# Patient Record
Sex: Female | Born: 1950 | Race: White | Hispanic: No | Marital: Married | Smoking: Never smoker
Health system: Southern US, Community
[De-identification: ages and names within clinical notes are randomized; demographics above are authoritative.]

## PROBLEM LIST (undated history)

## (undated) DIAGNOSIS — I10 Essential (primary) hypertension: Secondary | ICD-10-CM

## (undated) DIAGNOSIS — K219 Gastro-esophageal reflux disease without esophagitis: Secondary | ICD-10-CM

## (undated) DIAGNOSIS — E039 Hypothyroidism, unspecified: Secondary | ICD-10-CM

## (undated) DIAGNOSIS — E119 Type 2 diabetes mellitus without complications: Secondary | ICD-10-CM

## (undated) DIAGNOSIS — F32A Depression, unspecified: Secondary | ICD-10-CM

## (undated) DIAGNOSIS — F329 Major depressive disorder, single episode, unspecified: Secondary | ICD-10-CM

## (undated) DIAGNOSIS — E049 Nontoxic goiter, unspecified: Secondary | ICD-10-CM

## (undated) HISTORY — PX: OTHER SURGICAL HISTORY: SHX169

## (undated) HISTORY — DX: Depression, unspecified: F32.A

## (undated) HISTORY — PX: TUBAL LIGATION: SHX77

## (undated) HISTORY — PX: APPENDECTOMY: SHX54

## (undated) HISTORY — DX: Major depressive disorder, single episode, unspecified: F32.9

## (undated) HISTORY — DX: Type 2 diabetes mellitus without complications: E11.9

## (undated) HISTORY — DX: Nontoxic goiter, unspecified: E04.9

## (undated) HISTORY — PX: ABDOMINAL HYSTERECTOMY: SHX81

## (undated) HISTORY — PX: BACK SURGERY: SHX140

## (undated) HISTORY — DX: Gastro-esophageal reflux disease without esophagitis: K21.9

## (undated) HISTORY — DX: Essential (primary) hypertension: I10

---

## 2002-01-23 ENCOUNTER — Emergency Department (HOSPITAL_COMMUNITY): Admission: EM | Admit: 2002-01-23 | Discharge: 2002-01-23 | Payer: Self-pay | Admitting: Internal Medicine

## 2003-08-22 ENCOUNTER — Ambulatory Visit (HOSPITAL_COMMUNITY): Admission: RE | Admit: 2003-08-22 | Discharge: 2003-08-22 | Payer: Self-pay | Admitting: Ophthalmology

## 2003-11-07 ENCOUNTER — Ambulatory Visit (HOSPITAL_COMMUNITY): Admission: RE | Admit: 2003-11-07 | Discharge: 2003-11-07 | Payer: Self-pay | Admitting: Ophthalmology

## 2005-04-25 ENCOUNTER — Encounter: Admission: RE | Admit: 2005-04-25 | Discharge: 2005-04-25 | Payer: Self-pay | Admitting: General Surgery

## 2005-04-25 ENCOUNTER — Encounter (INDEPENDENT_AMBULATORY_CARE_PROVIDER_SITE_OTHER): Payer: Self-pay | Admitting: Specialist

## 2005-10-10 ENCOUNTER — Encounter: Admission: RE | Admit: 2005-10-10 | Discharge: 2005-10-10 | Payer: Self-pay | Admitting: General Surgery

## 2007-01-05 ENCOUNTER — Emergency Department (HOSPITAL_COMMUNITY): Admission: EM | Admit: 2007-01-05 | Discharge: 2007-01-05 | Payer: Self-pay | Admitting: Emergency Medicine

## 2010-10-12 LAB — DIFFERENTIAL
Basophils Absolute: 0.1
Basophils Relative: 1
Eosinophils Absolute: 0.7
Eosinophils Relative: 9 — ABNORMAL HIGH
Lymphocytes Relative: 36
Lymphs Abs: 2.7
Monocytes Absolute: 0.4
Monocytes Relative: 5
Neutro Abs: 3.7
Neutrophils Relative %: 49

## 2010-10-12 LAB — POCT CARDIAC MARKERS
CKMB, poc: 1.8
CKMB, poc: 2
Myoglobin, poc: 31.7
Myoglobin, poc: 62.1
Operator id: 263501
Operator id: 286941
Troponin i, poc: <0.05
Troponin i, poc: <0.05

## 2010-10-12 LAB — BASIC METABOLIC PANEL
BUN: 11
CO2: 24
Calcium: 9.3
Chloride: 103
Creatinine, Ser: 0.71
GFR calc Af Amer: >60
GFR calc non Af Amer: >60
Glucose, Bld: 87
Potassium: 3.3 — ABNORMAL LOW
Sodium: 139

## 2010-10-12 LAB — CBC
HCT: 39.6
Hemoglobin: 13.6
MCHC: 34.2
MCV: 85.3
Platelets: 212
RBC: 4.65
RDW: 13.4
WBC: 7.5

## 2014-07-18 ENCOUNTER — Other Ambulatory Visit (HOSPITAL_COMMUNITY): Payer: Self-pay | Admitting: Physician Assistant

## 2014-07-18 DIAGNOSIS — E049 Nontoxic goiter, unspecified: Secondary | ICD-10-CM

## 2014-07-22 ENCOUNTER — Ambulatory Visit (HOSPITAL_COMMUNITY)
Admission: RE | Admit: 2014-07-22 | Discharge: 2014-07-22 | Disposition: A | Discharge status: Home / Self Care | Payer: Medicare HMO | Source: Ambulatory Visit | Attending: Physician Assistant | Admitting: Physician Assistant

## 2014-07-22 DIAGNOSIS — R1310 Dysphagia, unspecified: Secondary | ICD-10-CM | POA: Diagnosis not present

## 2014-07-22 DIAGNOSIS — E010 Iodine-deficiency related diffuse (endemic) goiter: Secondary | ICD-10-CM | POA: Insufficient documentation

## 2014-07-22 DIAGNOSIS — E049 Nontoxic goiter, unspecified: Secondary | ICD-10-CM

## 2014-09-21 ENCOUNTER — Other Ambulatory Visit (HOSPITAL_COMMUNITY): Payer: Self-pay | Admitting: Respiratory Therapy

## 2014-09-21 DIAGNOSIS — R5383 Other fatigue: Secondary | ICD-10-CM

## 2014-09-21 DIAGNOSIS — R062 Wheezing: Secondary | ICD-10-CM

## 2014-10-27 ENCOUNTER — Other Ambulatory Visit: Payer: Self-pay | Admitting: "Endocrinology

## 2014-11-29 ENCOUNTER — Ambulatory Visit: Payer: Medicare HMO | Attending: Physician Assistant | Admitting: Sleep Medicine

## 2014-11-29 DIAGNOSIS — R5383 Other fatigue: Secondary | ICD-10-CM

## 2014-11-29 DIAGNOSIS — R062 Wheezing: Secondary | ICD-10-CM

## 2014-11-29 DIAGNOSIS — G479 Sleep disorder, unspecified: Secondary | ICD-10-CM | POA: Insufficient documentation

## 2014-12-03 NOTE — Sleep Study (Addendum)
  Hamlet A. Merlene Laughter, MD     [email or web address]             NOCTURNAL POLYSOMNOGRAPHY   LOCATION: [facility]    Patient Name: Bethany Myers, Bethany Myers Date: 11/29/2014 Gender: Female D.O.B: 07-31-1950 Age (years): 39 Referring Provider: Antionette Fairy Height (inches): 63 Interpreting Physician: Phillips Odor MD, ABSM Weight (lbs): 245 RPSGT: Rosebud Poles BMI: 43 MRN: [id number] Neck Size: 16.50 CLINICAL INFORMATION Sleep Study Type: NPSG Indication for sleep study: Fatigue Epworth Sleepiness Score: NA SLEEP STUDY TECHNIQUE As per the AASM Manual for the Scoring of Sleep and Associated Events v2.3 (April 2016) with a hypopnea requiring 4% desaturations. The channels recorded and monitored were frontal, central and occipital EEG, electrooculogram (EOG), submentalis EMG (chin), nasal and oral airflow, thoracic and abdominal wall motion, anterior tibialis EMG, snore microphone, electrocardiogram, and pulse oximetry. MEDICATIONS Prior to Admission medications   Medication Sig Start Date End Date Taking? Authorizing Provider  levothyroxine (SYNTHROID, LEVOTHROID) 50 MCG tablet TAKE 1 TABLET BY MOUTH IN THE MORNING. 10/27/14   Cassandria Anger, MD      SLEEP ARCHITECTURE The study was initiated at 10:50:43 PM and ended at 5:13:27 AM. Sleep onset time was 147.8 minutes and the sleep efficiency was 51.6%. The total sleep time was 197.5 minutes. Stage REM latency was N/A minutes. The patient spent 2.78% of the night in stage N1 sleep, 82.78% in stage N2 sleep, 14.43% in stage N3 and 0.00% in REM. Alpha intrusion was absent. Supine sleep was 0.00%. RESPIRATORY PARAMETERS The overall apnea/hypopnea index (AHI) was 3.3 per hour. There were 0 total apneas, including 0 obstructive, 0 central and 0 mixed apneas. There were 11 hypopneas and 0 RERAs. The AHI during Stage REM sleep was N/A per hour. AHI while supine was N/A per hour. The mean oxygen saturation  was 91.02%. The minimum SpO2 during sleep was 88.00%. Moderate snoring was noted during this study. CARDIAC DATA The 2 lead EKG demonstrated sinus rhythm. The mean heart rate was 67.49 beats per minute. Other EKG findings include: None. LEG MOVEMENT DATA The total PLMS were 0 with a resulting PLMS index of 0.00. Associated arousal with leg movement index was 0.0   IMPRESSIONS - Abnormal sleep architecture with markedly prolonged sleep onset and reduced sleep efficiency.  - No significant obstructive sleep apnea occurred during this study.   Delano Metz, MD Diplomate, American Board of Sleep Medicine.

## 2014-12-19 ENCOUNTER — Ambulatory Visit: Payer: Self-pay | Admitting: "Endocrinology

## 2017-03-10 ENCOUNTER — Encounter: Payer: Self-pay | Admitting: Internal Medicine

## 2017-03-12 ENCOUNTER — Telehealth: Payer: Self-pay

## 2017-03-12 NOTE — Telephone Encounter (Signed)
Please accept this pt as a new pt per Dr.Scott.Husband is Cesilia Shinn

## 2017-05-07 ENCOUNTER — Encounter: Payer: Self-pay | Admitting: Gastroenterology

## 2017-05-07 ENCOUNTER — Other Ambulatory Visit: Payer: Self-pay

## 2017-05-07 ENCOUNTER — Ambulatory Visit: Payer: Medicare HMO | Admitting: Gastroenterology

## 2017-05-07 DIAGNOSIS — R195 Other fecal abnormalities: Secondary | ICD-10-CM

## 2017-05-07 MED ORDER — PEG 3350-KCL-NA BICARB-NACL 420 G PO SOLR: 4000.0000 mL | ORAL | 0 refills | Status: DC

## 2017-05-07 NOTE — Progress Notes (Signed)
Primary Care Physician:  Zhou-Talbert, Elwyn Lade, MD  Primary Gastroenterologist:  Garfield Cornea, MD   Chief Complaint  Patient presents with  . positive FIT test    HPI:  Bethany Myers is a 67 y.o. female here at the request of Dr. Angelia Mould for further evaluation of Hemoccult positive stool.  Patient states she is never had a colonoscopy.  She does her yearly Hemoccults, typically they have been negative.  This past 1 was positive however. No overt GI bleeding. No brbpr, melena.  Rarely has constipation.  She had back surgery last year and developed loss sensation of the perineum, just recently starting to get the sensation back.  Made bowel movements a little bit more difficult but this is improved.  Denies upper GI symptoms, dysphagia.  Current Outpatient Medications  Medication Sig Dispense Refill  . amLODipine (NORVASC) 5 MG tablet Take 5 mg by mouth daily.     Marland Kitchen FLUoxetine (PROZAC) 40 MG capsule Take 40 mg by mouth daily.     . hydrochlorothiazide (HYDRODIURIL) 25 MG tablet Take 25 mg by mouth daily.     Marland Kitchen levothyroxine (SYNTHROID, LEVOTHROID) 50 MCG tablet TAKE 1 TABLET BY MOUTH IN THE MORNING. 30 tablet 2   No current facility-administered medications for this visit.     Allergies as of 05/07/2017 - Review Complete 05/07/2017  Allergen Reaction Noted  . Trimethoprim  05/07/2017    Past Medical History:  Diagnosis Date  . Depression   . Goiter   . Hypertension     Past Surgical History:  Procedure Laterality Date  . ABDOMINAL HYSTERECTOMY    . APPENDECTOMY    . BACK SURGERY    . cataracts    . TUBAL LIGATION      Family History  Problem Relation Age of Onset  . Constipation Mother        died at age 80, "blockage", ended up with colostomy but no cancer  . Suicidality Daughter        Died 01-12-17  . Colon cancer Neg Hx     Social History   Socioeconomic History  . Marital status: Married    Spouse name: Not on file  . Number of children: Not on  file  . Years of education: Not on file  . Highest education level: Not on file  Occupational History  . Not on file  Social Needs  . Financial resource strain: Not on file  . Food insecurity:    Worry: Not on file    Inability: Not on file  . Transportation needs:    Medical: Not on file    Non-medical: Not on file  Tobacco Use  . Smoking status: Never Smoker  . Smokeless tobacco: Never Used  Substance and Sexual Activity  . Alcohol use: Never    Frequency: Never  . Drug use: Never  . Sexual activity: Not on file  Lifestyle  . Physical activity:    Days per week: Not on file    Minutes per session: Not on file  . Stress: Not on file  Relationships  . Social connections:    Talks on phone: Not on file    Gets together: Not on file    Attends religious service: Not on file    Active member of club or organization: Not on file    Attends meetings of clubs or organizations: Not on file    Relationship status: Not on file  . Intimate partner violence:  Fear of current or ex partner: Not on file    Emotionally abused: Not on file    Physically abused: Not on file    Forced sexual activity: Not on file  Other Topics Concern  . Not on file  Social History Narrative  . Not on file      ROS:  General: Negative for anorexia, weight loss, fever, chills, fatigue, weakness. Eyes: Negative for vision changes.  ENT: Negative for hoarseness, difficulty swallowing , nasal congestion. CV: Negative for chest pain, angina, palpitations, dyspnea on exertion, peripheral edema.  Respiratory: Negative for dyspnea at rest, dyspnea on exertion, cough, sputum, wheezing.  GI: See history of present illness. GU:  Negative for dysuria, hematuria, urinary incontinence, urinary frequency, nocturnal urination.  MS: Chronic back pain Derm: Negative for rash or itching.  Neuro: Negative for weakness, abnormal sensation, seizure, frequent headaches, memory loss, confusion.  Psych: Negative  for  suicidal ideation, hallucinations.  Positive depression Endo: Negative for unusual weight change.  Heme: Negative for bruising or bleeding. Allergy: Negative for rash or hives.    Physical Examination:  BP 132/79   Pulse 84   Temp (!) 97 F (36.1 C) (Oral)   Ht 5\' 3"  (1.6 m)   Wt 232 lb (105.2 kg)   BMI 41.10 kg/m    General: Well-nourished, well-developed in no acute distress.  Head: Normocephalic, atraumatic.   Eyes: Conjunctiva pink, no icterus. Mouth: Oropharyngeal mucosa moist and pink , no lesions erythema or exudate. Neck: Supple without thyromegaly, masses, or lymphadenopathy.  Lungs: Clear to auscultation bilaterally.  Heart: Regular rate and rhythm, no murmurs rubs or gallops.  Abdomen: Bowel sounds are normal, nontender, nondistended, no hepatosplenomegaly or masses, no abdominal bruits or    hernia , no rebound or guarding.   Rectal: Not performed Extremities: No lower extremity edema. No clubbing or deformities.  Neuro: Alert and oriented x 4 , grossly normal neurologically.  Skin: Warm and dry, no rash or jaundice.   Psych: Alert and cooperative, normal mood and affect.   Imaging Studies: No results found.

## 2017-05-07 NOTE — Patient Instructions (Signed)
1. Colonoscopy as scheduled. See separate instructions.  

## 2017-05-07 NOTE — Progress Notes (Signed)
cc'ed to pcp °

## 2017-05-07 NOTE — Assessment & Plan Note (Signed)
67 year old female with recent Hemoccult positive stool.  No prior colonoscopy.  Denies any GI symptoms.  Plan for colonoscopy in the near future.  I have discussed the risks, alternatives, benefits with regards to but not limited to the risk of reaction to medication, bleeding, infection, perforation and the patient is agreeable to proceed. Written consent to be obtained.

## 2017-07-07 ENCOUNTER — Telehealth: Payer: Self-pay

## 2017-07-07 NOTE — Telephone Encounter (Signed)
Called [employer]. No PA needed for TCS. Ref# [id number].

## 2017-07-14 ENCOUNTER — Telehealth: Payer: Self-pay

## 2017-07-14 NOTE — Telephone Encounter (Signed)
Pt called office to cancel TCS that was scheduled for tomorrow. States her husband had colonoscopy this morning and she's too tired to do hers and she had something to happen. Said she will call back later. Endo scheduler aware.

## 2017-07-14 NOTE — Telephone Encounter (Signed)
Noted  

## 2017-07-15 ENCOUNTER — Ambulatory Visit (HOSPITAL_COMMUNITY): Admission: RE | Admit: 2017-07-15 | Payer: Medicare HMO | Source: Ambulatory Visit | Admitting: Internal Medicine

## 2017-07-15 ENCOUNTER — Encounter (HOSPITAL_COMMUNITY): Admission: RE | Payer: Self-pay | Source: Ambulatory Visit

## 2017-07-15 SURGERY — COLONOSCOPY
Anesthesia: Moderate Sedation

## 2018-06-09 ENCOUNTER — Other Ambulatory Visit: Payer: Self-pay

## 2018-06-09 ENCOUNTER — Ambulatory Visit: Payer: Medicare HMO | Admitting: Podiatry

## 2018-06-09 ENCOUNTER — Encounter: Payer: Self-pay | Admitting: Podiatry

## 2018-06-09 VITALS — Temp 97.6°F

## 2018-06-09 DIAGNOSIS — R2240 Localized swelling, mass and lump, unspecified lower limb: Secondary | ICD-10-CM | POA: Diagnosis not present

## 2018-06-09 DIAGNOSIS — M7989 Other specified soft tissue disorders: Secondary | ICD-10-CM

## 2018-06-09 MED ORDER — DOXYCYCLINE HYCLATE 100 MG PO TABS: 100.0000 mg | ORAL_TABLET | Freq: Two times a day (BID) | ORAL | 0 refills | Status: DC

## 2018-06-15 NOTE — Progress Notes (Signed)
   HPI: 68 year old female presents the office today as a new patient for evaluation of a painful lesion to the plantar aspect of the right forefoot.  She says it started with a blood blister that sometimes would drain and bleed.  This is been ongoing for approximately 1 month now.  She is been seen at the [facility] emergency department on 05/30/2018.  She has been treating it with peroxide and triple antibiotic ointment.  X-rays were done at the emergency department and patient was given oral Keflex.  She presents for further treatment and evaluation  Past Medical History:  Diagnosis Date  . Depression   . Goiter   . Hypertension      Physical Exam: General: The patient is alert and oriented x3 in no acute distress.  Dermatology: Skin is warm, dry and supple bilateral lower extremities. Negative for open lesions or macerations.  Soft tissue appears to be protruding out of the plantar aspect of the right forefoot.  Very sensitive to light touch.  Vascular: Palpable pedal pulses bilaterally. No edema or erythema noted. Capillary refill within normal limits.  Neurological: Epicritic and protective threshold grossly intact bilaterally.   Musculoskeletal Exam: Range of motion within normal limits to all pedal and ankle joints bilateral. Muscle strength 5/5 in all groups bilateral.   Assessment: 1.  Soft tissue mass right plantar forefoot   Plan of Care:  1. Patient evaluated.  2.  Today the protruding tissue was removed and sent to pathology for gross and microscopic exam.  Light dressing was applied 3.  Today we can order an MRI of the right forefoot 4.  Prescription for doxycycline 100 mg #20 twice daily 5.  Return to clinic in 3 weeks to review MRI results      Edrick Kins, DPM [facility]  Dr. Edrick Kins, DPM    [address], [place] [address]                Office [phone]  Fax [phone]

## 2018-06-25 ENCOUNTER — Telehealth: Payer: Self-pay

## 2018-06-25 DIAGNOSIS — M7989 Other specified soft tissue disorders: Secondary | ICD-10-CM

## 2018-06-25 NOTE — Telephone Encounter (Signed)
-----   Message from Edrick Kins, DPM sent at 06/15/2018  6:44 PM EDT ----- Regarding: MRI RT forefoot Just making sure this patient's MRI was ordered of her right forefoot  Thanks, Dr. Amalia Hailey

## 2018-06-25 NOTE — Telephone Encounter (Signed)
MRI approved from 06/25/2018 to 07/25/2018 Auth # [id number]  Patient was contacted and she will call scheduling to set up own MRI appt to her convenience.

## 2018-07-03 ENCOUNTER — Ambulatory Visit: Payer: Medicare HMO | Admitting: Podiatry

## 2018-07-13 ENCOUNTER — Other Ambulatory Visit: Payer: Self-pay

## 2018-07-13 ENCOUNTER — Telehealth: Payer: Self-pay

## 2018-07-13 DIAGNOSIS — M7989 Other specified soft tissue disorders: Secondary | ICD-10-CM

## 2018-07-13 NOTE — Telephone Encounter (Signed)
Corrected order has been entered in chart and MRI scheduling has been notified.

## 2018-07-13 NOTE — Telephone Encounter (Signed)
-----   Message from Roney Jaffe, RN sent at 07/09/2018 11:54 AM EDT ----- Tillie Rung at [facility] called stating that the diagnosis for the MRI is incorrect and that the MRI should be with and without  contrast. Please give her a call.   Tillie Rung extension [phone]

## 2018-07-16 ENCOUNTER — Other Ambulatory Visit: Payer: Self-pay

## 2018-07-16 ENCOUNTER — Ambulatory Visit: Payer: Medicare HMO

## 2018-07-16 ENCOUNTER — Telehealth: Payer: Self-pay | Admitting: *Deleted

## 2018-07-16 ENCOUNTER — Ambulatory Visit
Admission: RE | Admit: 2018-07-16 | Discharge: 2018-07-16 | Disposition: A | Discharge status: Home / Self Care | Payer: Medicare HMO | Source: Ambulatory Visit | Attending: Podiatry | Admitting: Podiatry

## 2018-07-16 DIAGNOSIS — R2240 Localized swelling, mass and lump, unspecified lower limb: Secondary | ICD-10-CM | POA: Diagnosis present

## 2018-07-16 DIAGNOSIS — M7989 Other specified soft tissue disorders: Secondary | ICD-10-CM

## 2018-07-16 LAB — POCT I-STAT CREATININE: Creatinine, Ser: 0.7 mg/dL (ref 0.44–1.00)

## 2018-07-16 MED ORDER — GADOBUTROL 1 MMOL/ML IV SOLN
10.0000 mL | Freq: Once | INTRAVENOUS | Status: AC | PRN
  Administered 2018-07-16: 10 mL via INTRAVENOUS

## 2018-07-16 NOTE — Telephone Encounter (Signed)
[facility] called for orders for MRI.

## 2018-07-16 NOTE — Telephone Encounter (Signed)
MRI order has been faxed to [facility] at [facility]

## 2018-07-21 ENCOUNTER — Ambulatory Visit: Payer: Medicare HMO | Admitting: Podiatry

## 2018-07-22 NOTE — Progress Notes (Signed)
Thanks

## 2018-07-28 ENCOUNTER — Ambulatory Visit: Payer: Medicare HMO | Admitting: Podiatry

## 2018-08-14 ENCOUNTER — Other Ambulatory Visit: Payer: Self-pay

## 2018-08-14 ENCOUNTER — Ambulatory Visit (INDEPENDENT_AMBULATORY_CARE_PROVIDER_SITE_OTHER): Payer: Medicare HMO | Admitting: Podiatry

## 2018-08-14 ENCOUNTER — Encounter: Payer: Self-pay | Admitting: Podiatry

## 2018-08-14 VITALS — Temp 98.3°F

## 2018-08-14 DIAGNOSIS — R2240 Localized swelling, mass and lump, unspecified lower limb: Secondary | ICD-10-CM

## 2018-08-14 DIAGNOSIS — L980 Pyogenic granuloma: Secondary | ICD-10-CM | POA: Diagnosis not present

## 2018-08-14 DIAGNOSIS — M7989 Other specified soft tissue disorders: Secondary | ICD-10-CM

## 2018-08-16 NOTE — Progress Notes (Signed)
   HPI: 68 year old female presenting today for follow up evaluation of a soft tissue mass of the right plantar forefoot. She states she is doing much better and the mass has decreased in size. She denies any pain or modifying factors. She had an MRI done on 07/16/2018. Patient is here for further evaluation and treatment.   Past Medical History:  Diagnosis Date  . Depression   . Goiter   . Hypertension      Physical Exam: General: The patient is alert and oriented x3 in no acute distress.  Dermatology: Skin is warm, dry and supple bilateral lower extremities. Negative for open lesions or macerations.    Vascular: Palpable pedal pulses bilaterally. No edema or erythema noted. Capillary refill within normal limits.  Neurological: Epicritic and protective threshold grossly intact bilaterally.   Musculoskeletal Exam: Range of motion within normal limits to all pedal and ankle joints bilateral. Muscle strength 5/5 in all groups bilateral.   MRI Impression:  1. No soft tissue mass, fluid collection or hematoma of the right forefoot. 2. 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 3. 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  Assessment: 1. Soft tissue mass right plantar forefoot - resolved 2. Pyogenic granuloma - resolved   Plan of Care:  1. Patient evaluated. MRI and biopsy reviewed.  2. Recommended good shoe gear.  3. Return to clinic as needed.      Edrick Kins, DPM [facility]  Dr. Edrick Kins, DPM    [address], [place] [address]                Office [phone]  Fax [phone]

## 2018-08-19 ENCOUNTER — Other Ambulatory Visit (HOSPITAL_COMMUNITY): Payer: Self-pay | Admitting: Family Medicine

## 2018-08-19 DIAGNOSIS — Z13820 Encounter for screening for osteoporosis: Secondary | ICD-10-CM

## 2018-08-24 ENCOUNTER — Encounter: Payer: Self-pay | Admitting: Internal Medicine

## 2018-09-03 ENCOUNTER — Other Ambulatory Visit: Payer: Self-pay

## 2018-09-03 ENCOUNTER — Ambulatory Visit (HOSPITAL_COMMUNITY)
Admission: RE | Admit: 2018-09-03 | Discharge: 2018-09-03 | Disposition: A | Discharge status: Home / Self Care | Payer: Medicare HMO | Source: Ambulatory Visit | Attending: Family Medicine | Admitting: Family Medicine

## 2018-09-03 DIAGNOSIS — M8589 Other specified disorders of bone density and structure, multiple sites: Secondary | ICD-10-CM | POA: Diagnosis not present

## 2018-09-03 DIAGNOSIS — Z13820 Encounter for screening for osteoporosis: Secondary | ICD-10-CM | POA: Diagnosis present

## 2018-09-17 ENCOUNTER — Ambulatory Visit: Payer: Medicare HMO | Admitting: Nurse Practitioner

## 2018-09-21 ENCOUNTER — Ambulatory Visit: Payer: Medicare HMO | Admitting: Orthopedic Surgery

## 2018-09-21 ENCOUNTER — Encounter: Payer: Self-pay | Admitting: Orthopedic Surgery

## 2018-10-08 ENCOUNTER — Ambulatory Visit: Payer: Medicare HMO | Admitting: Nurse Practitioner

## 2019-06-22 ENCOUNTER — Other Ambulatory Visit: Payer: Self-pay

## 2019-06-22 ENCOUNTER — Encounter: Payer: Self-pay | Admitting: Orthopaedic Surgery

## 2019-06-22 ENCOUNTER — Ambulatory Visit: Payer: Medicare (Managed Care) | Admitting: Orthopaedic Surgery

## 2019-06-22 VITALS — BP 147/88 | HR 97 | Ht 63.0 in | Wt 239.0 lb

## 2019-06-22 DIAGNOSIS — Z6841 Body Mass Index (BMI) 40.0 and over, adult: Secondary | ICD-10-CM | POA: Diagnosis not present

## 2019-06-22 DIAGNOSIS — M25562 Pain in left knee: Secondary | ICD-10-CM

## 2019-06-22 DIAGNOSIS — G8929 Other chronic pain: Secondary | ICD-10-CM | POA: Diagnosis not present

## 2019-06-22 NOTE — Progress Notes (Signed)
Subjective:    Patient ID: Bethany Myers, female    DOB: 1950/11/10, 69 y.o.   MRN: [id number]  HPI She has a long history of pain in the knees, more on the left. She has popping and swelling of the left knee with some giving way at times.  She has no redness no numbness no trauma.  She was seen at [facility].  I have reviewed their notes. She is on ibuprofen tid which helps some. She is just getting worse. X-rays from [place] show marked DJD of the left knee, three compartments, worse medially with osteophytes.  I have independently reviewed and interpreted x-rays of this patient done at another site by another physician or qualified health professional.     Review of Systems  Constitutional: Positive for activity change.  Musculoskeletal: Positive for arthralgias, gait problem and joint swelling.  All other systems reviewed and are negative.  For Review of Systems, all other systems reviewed and are negative.  The following is a summary of the past history medically, past history surgically, known current medicines, social history and family history.  This information is gathered electronically by the computer from prior information and documentation.  I review this each visit and have found including this information at this point in the chart is beneficial and informative.   Past Medical History:  Diagnosis Date  . Depression   . Goiter   . Hypertension     Past Surgical History:  Procedure Laterality Date  . ABDOMINAL HYSTERECTOMY    . APPENDECTOMY    . BACK SURGERY    . cataracts    . TUBAL LIGATION      Current Outpatient Medications on File Prior to Visit  Medication Sig Dispense Refill  . amLODipine (NORVASC) 5 MG tablet Take 5 mg by mouth daily.     . cyclobenzaprine (FLEXERIL) 10 MG tablet     . FLUoxetine (PROZAC) 20 MG capsule     . FLUoxetine (PROZAC) 40 MG capsule Take 40 mg by mouth daily.     . fluticasone (FLONASE) 50 MCG/ACT nasal spray Place 2  sprays into both nostrils daily.    . hydrochlorothiazide (HYDRODIURIL) 25 MG tablet Take 25 mg by mouth daily.     Marland Kitchen ibuprofen (ADVIL,MOTRIN) 800 MG tablet Take 800 mg by mouth every 6 (six) hours as needed for headache or moderate pain.    Marland Kitchen levothyroxine (SYNTHROID, LEVOTHROID) 50 MCG tablet TAKE 1 TABLET BY MOUTH IN THE MORNING. 30 tablet 2  . polyethylene glycol-electrolytes (TRILYTE) 420 g solution Take 4,000 mLs by mouth as directed. 4000 mL 0  . potassium chloride SA (K-DUR) 20 MEQ tablet     . cephALEXin (KEFLEX) 500 MG capsule TAKE 1 CAPSULE BY MOUTH THREE TIMES A DAY (Patient not taking: Reported on 06/22/2019)    . doxycycline (VIBRA-TABS) 100 MG tablet Take 1 tablet (100 mg total) by mouth 2 (two) times daily. (Patient not taking: Reported on 06/22/2019) 20 tablet 0   No current facility-administered medications on file prior to visit.    Social History   Socioeconomic History  . Marital status: Married    Spouse name: Not on file  . Number of children: Not on file  . Years of education: Not on file  . Highest education level: Not on file  Occupational History  . Not on file  Tobacco Use  . Smoking status: Never Smoker  . Smokeless tobacco: Never Used  Substance and Sexual Activity  .  Alcohol use: Never  . Drug use: Never  . Sexual activity: Not on file  Other Topics Concern  . Not on file  Social History Narrative  . Not on file   Social Determinants of Health   Financial Resource Strain:   . Difficulty of Paying Living Expenses:   Food Insecurity:   . Worried About Charity fundraiser in the Last Year:   . Arboriculturist in the Last Year:   Transportation Needs:   . Film/video editor (Medical):   Marland Kitchen Lack of Transportation (Non-Medical):   Physical Activity:   . Days of Exercise per Week:   . Minutes of Exercise per Session:   Stress:   . Feeling of Stress :   Social Connections:   . Frequency of Communication with Friends and Family:   . Frequency  of Social Gatherings with Friends and Family:   . Attends Religious Services:   . Active Member of Clubs or Organizations:   . Attends Archivist Meetings:   Marland Kitchen Marital Status:   Intimate Partner Violence:   . Fear of Current or Ex-Partner:   . Emotionally Abused:   Marland Kitchen Physically Abused:   . Sexually Abused:     Family History  Problem Relation Age of Onset  . Constipation Mother        died at age 86, "blockage", ended up with colostomy but no cancer  . Suicidality Daughter        Died 01-09-17  . Colon cancer Neg Hx     BP (!) 147/88   Pulse 97   Ht 5\' 3"  (1.6 m)   Wt 239 lb (108.4 kg)   BMI 42.34 kg/m   Body mass index is 42.34 kg/m. The patient meets the AMA guidelines for Morbid (severe) obesity with a BMI > 40.0 and I have recommended weight loss.      Objective:   Physical Exam Vitals and nursing note reviewed.  Constitutional:      Appearance: She is well-developed.  HENT:     Head: Normocephalic and atraumatic.  Eyes:     Conjunctiva/sclera: Conjunctivae normal.     Pupils: Pupils are equal, round, and reactive to light.  Cardiovascular:     Rate and Rhythm: Normal rate and regular rhythm.  Pulmonary:     Effort: Pulmonary effort is normal.  Abdominal:     Palpations: Abdomen is soft.  Musculoskeletal:     Cervical back: Normal range of motion and neck supple.       Legs:  Skin:    General: Skin is warm and dry.  Neurological:     Mental Status: She is alert and oriented to person, place, and time.     Cranial Nerves: No cranial nerve deficit.     Motor: No abnormal muscle tone.     Coordination: Coordination normal.     Deep Tendon Reflexes: Reflexes are normal and symmetric. Reflexes normal.  Psychiatric:        Behavior: Behavior normal.        Thought Content: Thought content normal.        Judgment: Judgment normal.           Assessment & Plan:   Encounter Diagnoses  Name Primary?  . Chronic pain of left knee Yes    . Body mass index 40.0-44.9, adult ([place])   . Morbid obesity ([place])    PROCEDURE NOTE:  The patient requests injections of the left knee ,  verbal consent was obtained.  The left knee was prepped appropriately after time out was performed.   Sterile technique was observed and injection of 1 cc of Depo-Medrol 40 mg with several cc's of plain xylocaine. Anesthesia was provided by ethyl chloride and a 20-gauge needle was used to inject the knee area. The injection was tolerated well.  A band aid dressing was applied.  The patient was advised to apply ice later today and tomorrow to the injection sight as needed.  Continue the ibuprofen.  Return in two weeks.  She is a candidate for a total knee.  Call if any problem.  Precautions discussed.   Electronically Signed Sanjuana Kava, MD 6/15/202112:04 PM

## 2019-07-06 ENCOUNTER — Ambulatory Visit (INDEPENDENT_AMBULATORY_CARE_PROVIDER_SITE_OTHER): Payer: Medicare (Managed Care) | Admitting: Orthopaedic Surgery

## 2019-07-06 ENCOUNTER — Other Ambulatory Visit: Payer: Self-pay

## 2019-07-06 ENCOUNTER — Encounter: Payer: Self-pay | Admitting: Orthopaedic Surgery

## 2019-07-06 VITALS — BP 141/83 | HR 74 | Ht 63.0 in | Wt 240.0 lb

## 2019-07-06 DIAGNOSIS — G8929 Other chronic pain: Secondary | ICD-10-CM

## 2019-07-06 DIAGNOSIS — M25562 Pain in left knee: Secondary | ICD-10-CM | POA: Diagnosis not present

## 2019-07-06 DIAGNOSIS — Z6841 Body Mass Index (BMI) 40.0 and over, adult: Secondary | ICD-10-CM | POA: Diagnosis not present

## 2019-07-06 NOTE — Progress Notes (Signed)
Patient Bethany Myers, female DOB:1950/11/04, 69 y.o. MEQ:[id number]  Chief Complaint  Patient presents with  . Knee Pain    Left worse than right    HPI  Bethany Myers is a 69 y.o. female who has chronic pain of the left knee.  The injection last time did not help.  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  She is a candidate for a total knee.  I would like to get MRI of the knee.  She should seriously consider the total knee surgery.   Body mass index is 42.51 kg/m.  The patient meets the AMA guidelines for Morbid (severe) obesity with a BMI > 40.0 and I have recommended weight loss.   ROS  Review of Systems  Constitutional: Positive for activity change.  Musculoskeletal: Positive for arthralgias, gait problem and joint swelling.  All other systems reviewed and are negative.   All other systems reviewed and are negative.  The following is a summary of the past history medically, past history surgically, known current medicines, social history and family history.  This information is gathered electronically by the computer from prior information and documentation.  I review this each visit and have found including this information at this point in the chart is beneficial and informative.    Past Medical History:  Diagnosis Date  . Depression   . Goiter   . Hypertension     Past Surgical History:  Procedure Laterality Date  . ABDOMINAL HYSTERECTOMY    . APPENDECTOMY    . BACK SURGERY    . cataracts    . TUBAL LIGATION      Family History  Problem Relation Age of Onset  . Constipation Mother        died at age 84, "blockage", ended up with colostomy but no cancer  . Suicidality Daughter        Died January 02, 2017  . Colon cancer Neg Hx     Social History Social History   Tobacco Use  . Smoking status: Never Smoker  . Smokeless tobacco: Never Used  Substance Use Topics  . Alcohol use: Never  . Drug use: Never    Allergies  Allergen Reactions  .  Trimethoprim Swelling, Rash and Other (See Comments)    Tongue swelling    Current Outpatient Medications  Medication Sig Dispense Refill  . amLODipine (NORVASC) 5 MG tablet Take 5 mg by mouth daily.     . cephALEXin (KEFLEX) 500 MG capsule TAKE 1 CAPSULE BY MOUTH THREE TIMES A DAY    . cyclobenzaprine (FLEXERIL) 10 MG tablet     . doxycycline (VIBRA-TABS) 100 MG tablet Take 1 tablet (100 mg total) by mouth 2 (two) times daily. 20 tablet 0  . FLUoxetine (PROZAC) 20 MG capsule     . FLUoxetine (PROZAC) 40 MG capsule Take 40 mg by mouth daily.     . fluticasone (FLONASE) 50 MCG/ACT nasal spray Place 2 sprays into both nostrils daily.    . hydrochlorothiazide (HYDRODIURIL) 25 MG tablet Take 25 mg by mouth daily.     Marland Kitchen ibuprofen (ADVIL,MOTRIN) 800 MG tablet Take 800 mg by mouth every 6 (six) hours as needed for headache or moderate pain.    Marland Kitchen levothyroxine (SYNTHROID, LEVOTHROID) 50 MCG tablet TAKE 1 TABLET BY MOUTH IN THE MORNING. 30 tablet 2  . metFORMIN (GLUCOPHAGE) 500 MG tablet Take by mouth 2 (two) times daily with a meal.    . polyethylene glycol-electrolytes (TRILYTE) 420 g solution  Take 4,000 mLs by mouth as directed. 4000 mL 0  . potassium chloride SA (K-DUR) 20 MEQ tablet     . pravastatin (PRAVACHOL) 20 MG tablet Take 20 mg by mouth daily.     No current facility-administered medications for this visit.     Physical Exam  Blood pressure (!) 141/83, pulse 74, height 5\' 3"  (1.6 m), weight 240 lb (108.9 kg).  Constitutional: overall normal hygiene, normal nutrition, well developed, normal grooming, normal body habitus. Assistive device:none  Musculoskeletal: gait and station Limp left, muscle tone and strength are normal, no tremors or atrophy is present.  .  Neurological: coordination overall normal.  Deep tendon reflex/nerve stretch intact.  Sensation normal.  Cranial nerves II-XII intact.   Skin:   Normal overall no scars, lesions, ulcers or rashes. No  psoriasis.  Psychiatric: Alert and oriented x 3.  Recent memory intact, remote memory unclear.  Normal mood and affect. Well groomed.  Good eye contact.  Cardiovascular: overall no swelling, no varicosities, no edema bilaterally, normal temperatures of the legs and arms, no clubbing, cyanosis and good capillary refill.  Lymphatic: palpation is normal.  Left knee with pain, effusion, crepitus, limp left, ROM 0 to 105, positive medial Mc[place], medial joint line pain.  NV intact.  All other systems reviewed and are negative   The patient has been educated about the nature of the problem(s) and counseled on treatment options.  The patient appeared to understand what I have discussed and is in agreement with it.  Encounter Diagnoses  Name Primary?  . Chronic pain of left knee Yes  . Body mass index 40.0-44.9, adult ([place])   . Morbid obesity ([place])     PLAN Call if any problems.  Precautions discussed.  Continue current medications.   Return to clinic 2 weeks   Get MRI of the left knee.  Electronically Signed Sanjuana Kava, MD 6/29/20218:50 AM

## 2019-07-27 ENCOUNTER — Ambulatory Visit: Payer: Medicare (Managed Care) | Admitting: Orthopaedic Surgery

## 2019-08-09 ENCOUNTER — Telehealth: Payer: Self-pay | Admitting: Orthopaedic Surgery

## 2019-08-09 NOTE — Telephone Encounter (Signed)
Bethany Myers says she got a call from [facility] stating that she would end up paying for the MRI herself.  MRI has been put off for right now. Not sure if it's because her insurance wont cover an MRI due to certain requirements, PT etc or if insurance is out of network or if the patient just hasnt met a deductible

## 2019-08-10 ENCOUNTER — Ambulatory Visit (HOSPITAL_COMMUNITY): Payer: Medicare (Managed Care)

## 2019-08-12 ENCOUNTER — Ambulatory Visit: Payer: Medicare (Managed Care) | Admitting: Orthopaedic Surgery

## 2019-12-05 ENCOUNTER — Other Ambulatory Visit: Payer: Self-pay

## 2019-12-05 ENCOUNTER — Encounter (HOSPITAL_COMMUNITY): Payer: Self-pay

## 2019-12-05 ENCOUNTER — Emergency Department (HOSPITAL_COMMUNITY)
Admission: EM | Admit: 2019-12-05 | Discharge: 2019-12-05 | Disposition: A | Discharge status: Home / Self Care | Payer: Medicare (Managed Care) | Attending: Emergency Medicine | Admitting: Emergency Medicine

## 2019-12-05 ENCOUNTER — Emergency Department (HOSPITAL_COMMUNITY): Payer: Medicare (Managed Care)

## 2019-12-05 DIAGNOSIS — Z7984 Long term (current) use of oral hypoglycemic drugs: Secondary | ICD-10-CM | POA: Insufficient documentation

## 2019-12-05 DIAGNOSIS — Z79899 Other long term (current) drug therapy: Secondary | ICD-10-CM | POA: Insufficient documentation

## 2019-12-05 DIAGNOSIS — R1110 Vomiting, unspecified: Secondary | ICD-10-CM | POA: Diagnosis not present

## 2019-12-05 DIAGNOSIS — N838 Other noninflammatory disorders of ovary, fallopian tube and broad ligament: Secondary | ICD-10-CM

## 2019-12-05 DIAGNOSIS — N839 Noninflammatory disorder of ovary, fallopian tube and broad ligament, unspecified: Secondary | ICD-10-CM | POA: Diagnosis not present

## 2019-12-05 DIAGNOSIS — I10 Essential (primary) hypertension: Secondary | ICD-10-CM | POA: Insufficient documentation

## 2019-12-05 DIAGNOSIS — Z90710 Acquired absence of both cervix and uterus: Secondary | ICD-10-CM | POA: Diagnosis not present

## 2019-12-05 DIAGNOSIS — Z9089 Acquired absence of other organs: Secondary | ICD-10-CM | POA: Diagnosis not present

## 2019-12-05 DIAGNOSIS — R1013 Epigastric pain: Secondary | ICD-10-CM | POA: Diagnosis present

## 2019-12-05 LAB — CBC WITH DIFFERENTIAL/PLATELET
Abs Immature Granulocytes: 0.06 10*3/uL (ref 0.00–0.07)
Basophils Absolute: 0.1 10*3/uL (ref 0.0–0.1)
Basophils Relative: 1 %
Eosinophils Absolute: 0.4 10*3/uL (ref 0.0–0.5)
Eosinophils Relative: 5 %
HCT: 41.4 % (ref 36.0–46.0)
Hemoglobin: 13.4 g/dL (ref 12.0–15.0)
Immature Granulocytes: 1 %
Lymphocytes Relative: 29 %
Lymphs Abs: 2 10*3/uL (ref 0.7–4.0)
MCH: 28.9 pg (ref 26.0–34.0)
MCHC: 32.4 g/dL (ref 30.0–36.0)
MCV: 89.2 fL (ref 80.0–100.0)
Monocytes Absolute: 0.3 10*3/uL (ref 0.1–1.0)
Monocytes Relative: 5 %
Neutro Abs: 4.2 10*3/uL (ref 1.7–7.7)
Neutrophils Relative %: 59 %
Platelets: 250 10*3/uL (ref 150–400)
RBC: 4.64 MIL/uL (ref 3.87–5.11)
RDW: 13.7 % (ref 11.5–15.5)
WBC: 6.9 10*3/uL (ref 4.0–10.5)
nRBC: 0 % (ref 0.0–0.2)

## 2019-12-05 LAB — LIPASE, BLOOD: Lipase: 40 U/L (ref 11–51)

## 2019-12-05 LAB — COMPREHENSIVE METABOLIC PANEL
ALT: 25 U/L (ref 0–44)
AST: 24 U/L (ref 15–41)
Albumin: 3.6 g/dL (ref 3.5–5.0)
Alkaline Phosphatase: 91 U/L (ref 38–126)
Anion gap: 11 (ref 5–15)
BUN: 12 mg/dL (ref 8–23)
CO2: 27 mmol/L (ref 22–32)
Calcium: 9.4 mg/dL (ref 8.9–10.3)
Chloride: 98 mmol/L (ref 98–111)
Creatinine, Ser: 0.89 mg/dL (ref 0.44–1.00)
GFR, Estimated: >60 mL/min (ref >60)
Glucose, Bld: 172 mg/dL — ABNORMAL HIGH (ref 70–99)
Potassium: 3.4 mmol/L — ABNORMAL LOW (ref 3.5–5.1)
Sodium: 136 mmol/L (ref 135–145)
Total Bilirubin: 0.3 mg/dL (ref 0.3–1.2)
Total Protein: 8.7 g/dL — ABNORMAL HIGH (ref 6.5–8.1)

## 2019-12-05 LAB — URINALYSIS, ROUTINE W REFLEX MICROSCOPIC
Bilirubin Urine: NEGATIVE
Glucose, UA: NEGATIVE mg/dL
Hgb urine dipstick: NEGATIVE
Ketones, ur: NEGATIVE mg/dL
Leukocytes,Ua: NEGATIVE
Nitrite: NEGATIVE
Protein, ur: NEGATIVE mg/dL
Specific Gravity, Urine: 1.041 — ABNORMAL HIGH (ref 1.005–1.030)
pH: 7 (ref 5.0–8.0)

## 2019-12-05 LAB — TROPONIN I (HIGH SENSITIVITY): Troponin I (High Sensitivity): 5 ng/L (ref <18)

## 2019-12-05 MED ORDER — ONDANSETRON HCL 4 MG/2ML IJ SOLN
4.0000 mg | Freq: Once | INTRAMUSCULAR | Status: AC
  Administered 2019-12-05: 4 mg via INTRAVENOUS
  Filled 2019-12-05: qty 2

## 2019-12-05 MED ORDER — PANTOPRAZOLE SODIUM 40 MG IV SOLR
40.0000 mg | INTRAVENOUS | Status: AC
  Administered 2019-12-05: 40 mg via INTRAVENOUS
  Filled 2019-12-05: qty 40

## 2019-12-05 MED ORDER — HYDROMORPHONE HCL 1 MG/ML IJ SOLN
1.0000 mg | Freq: Once | INTRAMUSCULAR | Status: AC
  Administered 2019-12-05: 1 mg via INTRAVENOUS
  Filled 2019-12-05: qty 1

## 2019-12-05 MED ORDER — ONDANSETRON 4 MG PO TBDP: 4.0000 mg | ORAL_TABLET | Freq: Three times a day (TID) | ORAL | 0 refills | Status: DC | PRN

## 2019-12-05 MED ORDER — IOHEXOL 300 MG/ML  SOLN
100.0000 mL | Freq: Once | INTRAMUSCULAR | Status: AC | PRN
  Administered 2019-12-05: 100 mL via INTRAVENOUS

## 2019-12-05 MED ORDER — PANTOPRAZOLE SODIUM 20 MG PO TBEC: 20.0000 mg | DELAYED_RELEASE_TABLET | Freq: Every day | ORAL | 1 refills | Status: DC

## 2019-12-05 MED ORDER — HYDROCODONE-ACETAMINOPHEN 5-325 MG PO TABS
2.0000 | ORAL_TABLET | ORAL | 0 refills | Status: DC | PRN
Start: 2019-12-05 — End: 2020-04-05

## 2019-12-05 NOTE — ED Notes (Signed)
Pt ate sausage and grave w eggs this and   3 hours ago more or less RUQ pain into back   Pt tried tums without relief   Pt is morbidly obese with active vomiting enroute to ED   meds as ordered   Awaiting lab results for CT abd

## 2019-12-05 NOTE — Discharge Instructions (Signed)
Please be aware that your CT scan showed that you had abnormal appearing ovaries, this could be concerning for cancer and you will need to follow-up with your family doctor or gynecologist this week without fail.  If you should develop severe or worsening symptoms including increasing pain or vomiting return to the ER immediately.

## 2019-12-05 NOTE — ED Triage Notes (Signed)
Pt to er room number 68, pt states that she is here for abd pain and vomiting, states that the pain started about 3 hours ago, pt c/o epigastric pain.

## 2019-12-05 NOTE — ED Provider Notes (Signed)
[facility] EMERGENCY DEPARTMENT Provider Note   CSN: [id number] Arrival date & time: 12/05/19  1836     History Chief Complaint  Patient presents with  . Abdominal Pain    Bethany Myers is a 69 y.o. female.  HPI   This patient is a 69 year old female, she has a history of hypertension, prior appendicitis and hysterectomy, no history of prior gallstones pancreatitis or stomach ulcers.  She was in her usual state of health until approximately 2 hours ago when she had acute onset of pain in the epigastrium, seems to radiate to the right and the left as well as towards her back and is associated with multiple episodes of vomiting.  She has never had anything like this in the past, she does not drink alcohol, she has not had any urinary symptoms and denies any blood in her stools or abnormal colored stools.  She has not had any pain medicines prior to arrival and arrives by private vehicle.  Symptoms are persistent, severe, nothing seems to make it better or worse, she cannot find a comfortable position.  Past Medical History:  Diagnosis Date  . Depression   . Goiter   . Hypertension     Patient Active Problem List   Diagnosis Date Noted  . Heme positive stool 05/07/2017    Past Surgical History:  Procedure Laterality Date  . ABDOMINAL HYSTERECTOMY    . APPENDECTOMY    . BACK SURGERY    . cataracts    . TUBAL LIGATION       OB History   No obstetric history on file.     Family History  Problem Relation Age of Onset  . Constipation Mother        died at age 35, "blockage", ended up with colostomy but no cancer  . Suicidality Daughter        Died Jan 15, 2017  . Colon cancer Neg Hx     Social History   Tobacco Use  . Smoking status: Never Smoker  . Smokeless tobacco: Never Used  Substance Use Topics  . Alcohol use: Never  . Drug use: Never    Home Medications Prior to Admission medications   Medication Sig Start Date End Date Taking? Authorizing  Provider  amLODipine (NORVASC) 5 MG tablet Take 5 mg by mouth daily.  02/21/17   [provider]  cephALEXin (KEFLEX) 500 MG capsule TAKE 1 CAPSULE BY MOUTH THREE TIMES A DAY 05/30/18   [provider]  cyclobenzaprine (FLEXERIL) 10 MG tablet  03/26/18   [provider]  doxycycline (VIBRA-TABS) 100 MG tablet Take 1 tablet (100 mg total) by mouth 2 (two) times daily. 06/09/18   Edrick Kins, DPM  FLUoxetine (PROZAC) 20 MG capsule  05/26/18   [provider]  FLUoxetine (PROZAC) 40 MG capsule Take 40 mg by mouth daily.  02/04/17   [provider]  fluticasone (FLONASE) 50 MCG/ACT nasal spray Place 2 sprays into both nostrils daily.    [provider]  hydrochlorothiazide (HYDRODIURIL) 25 MG tablet Take 25 mg by mouth daily.  02/21/17   [provider]  HYDROcodone-acetaminophen (NORCO/VICODIN) 5-325 MG tablet Take 2 tablets by mouth every 4 (four) hours as needed. 12/05/19   Noemi Chapel, MD  ibuprofen (ADVIL,MOTRIN) 800 MG tablet Take 800 mg by mouth every 6 (six) hours as needed for headache or moderate pain.    [provider]  levothyroxine (SYNTHROID, LEVOTHROID) 50 MCG tablet TAKE 1 TABLET BY MOUTH  IN THE MORNING. 10/27/14   Cassandria Anger, MD  metFORMIN (GLUCOPHAGE) 500 MG tablet Take by mouth 2 (two) times daily with a meal.    [provider]  ondansetron (ZOFRAN ODT) 4 MG disintegrating tablet Take 1 tablet (4 mg total) by mouth every 8 (eight) hours as needed for nausea. 12/05/19   Noemi Chapel, MD  pantoprazole (PROTONIX) 20 MG tablet Take 1 tablet (20 mg total) by mouth daily. 12/05/19   Noemi Chapel, MD  polyethylene glycol-electrolytes (TRILYTE) 420 g solution Take 4,000 mLs by mouth as directed. 05/07/17   Rourk, Cristopher Estimable, MD  potassium chloride SA (K-DUR) 20 MEQ tablet  03/16/18   [provider]  pravastatin (PRAVACHOL) 20 MG tablet Take 20 mg by mouth daily.    [provider]     Allergies    Trimethoprim  Review of Systems   Review of Systems  All other systems reviewed and are negative.   Physical Exam Updated Vital Signs BP (!) 169/89 (BP Location: Left Arm)   Pulse 79   Temp 98 F (36.7 C) (Oral)   Resp 16   Ht 1.6 m (5\' 3" )   Wt 105.7 kg   SpO2 95%   BMI 41.27 kg/m   Physical Exam Vitals and nursing note reviewed.  Constitutional:      General: She is in acute distress.     Appearance: She is well-developed.  HENT:     Head: Normocephalic and atraumatic.     Mouth/Throat:     Pharynx: No oropharyngeal exudate.  Eyes:     General: No scleral icterus.       Right eye: No discharge.        Left eye: No discharge.     Conjunctiva/sclera: Conjunctivae normal.     Pupils: Pupils are equal, round, and reactive to light.  Neck:     Thyroid: No thyromegaly.     Vascular: No JVD.  Cardiovascular:     Rate and Rhythm: Normal rate and regular rhythm.     Heart sounds: Normal heart sounds. No murmur heard.  No friction rub. No gallop.   Pulmonary:     Effort: Pulmonary effort is normal. No respiratory distress.     Breath sounds: Normal breath sounds. No wheezing or rales.  Abdominal:     General: Bowel sounds are normal. There is no distension.     Palpations: Abdomen is soft. There is no mass.     Tenderness: There is abdominal tenderness.     Comments: Tender to palpation midepigastrium, abdomen is overweight obese and rotund, not guarding or peritoneal, pain is equal in the epigastrium left upper and right upper quadrants.  Musculoskeletal:        General: No tenderness. Normal range of motion.     Cervical back: Normal range of motion and neck supple.  Lymphadenopathy:     Cervical: No cervical adenopathy.  Skin:    General: Skin is warm and dry.     Findings: No erythema or rash.  Neurological:     Mental Status: She is alert.     Coordination: Coordination normal.  Psychiatric:        Behavior: Behavior normal.     ED  Results / Procedures / Treatments   Labs (all labs ordered are listed, but only abnormal results are displayed) Labs Reviewed  COMPREHENSIVE METABOLIC PANEL - Abnormal; Notable for the following components:      Result Value   Potassium 3.4 (*)  Glucose, Bld 172 (*)    Total Protein 8.7 (*)    All other components within normal limits  URINALYSIS, ROUTINE W REFLEX MICROSCOPIC - Abnormal; Notable for the following components:   Specific Gravity, Urine 1.041 (*)    All other components within normal limits  LIPASE, BLOOD  CBC WITH DIFFERENTIAL/PLATELET  TROPONIN I (HIGH SENSITIVITY)    EKG EKG Interpretation  Date/Time:  Sunday December 05 2019 21:27:48 EST Ventricular Rate:  79 PR Interval:    QRS Duration: 92 QT Interval:  401 QTC Calculation: 460 R Axis:   63 Text Interpretation: Sinus rhythm Nonspecific T abnormalities, lateral leads since last tracing no significant change Confirmed by Noemi Chapel [phone]) on 12/05/2019 9:54:30 PM   Radiology CT ABDOMEN PELVIS W CONTRAST  Result Date: 12/05/2019 CLINICAL DATA:  Epigastric pain and vomiting for several hours EXAM: CT ABDOMEN AND PELVIS WITH CONTRAST TECHNIQUE: Multidetector CT imaging of the abdomen and pelvis was performed using the standard protocol following bolus administration of intravenous contrast. CONTRAST:  146mL OMNIPAQUE IOHEXOL 300 MG/ML  SOLN COMPARISON:  None. FINDINGS: Lower chest: No acute abnormality. Hepatobiliary: Multiple gallstones are noted within a well distended gallbladder. No obstructive changes are seen. Liver is within normal limits. Common bile duct is unremarkable. Pancreas: Unremarkable. No pancreatic ductal dilatation or surrounding inflammatory changes. Spleen: Spleen demonstrates a focal hypodensity best seen on image number 28 of series 2 incompletely evaluated on this exam likely representing a hemangioma. Adrenals/Urinary Tract: Adrenal glands are within normal limits. Kidneys demonstrate  1.6 cm cyst in the upper pole of the left kidney no obstructing stones are seen although evaluation is limited due to significant contrast enhancement within the kidneys at the time of imaging. The bladder is within normal limits. Stomach/Bowel: Scattered diverticular change of the colon is noted primarily within the sigmoid colon. No obstructive or inflammatory changes are seen. No findings of diverticulitis are noted. The appendix has been surgically removed. The stomach and small bowel appear within normal limits. Vascular/Lymphatic: No significant vascular findings are present. No enlarged abdominal or pelvic lymph nodes. Reproductive: Uterus has been surgically removed. To the left of the midline there is somewhat cystic lesion identified measuring 2.3 cm. A large partially cystic lesion is noted to the right of the midline measuring 7.3 x 6.7 cm with some mixed attenuation within. Other: No abdominal wall hernia or abnormality. No abdominopelvic ascites. Musculoskeletal: Degenerative changes of lumbar spine are noted. No other focal abnormality is seen. IMPRESSION: Bilateral somewhat cystic lesions within the ovaries. Largest of these is noted on the right measuring up to 7.3 cm. Given the size in the postmenopausal state, neoplasm is highly considered. Transvaginal ultrasound of the pelvis may be helpful for further evaluation. Alternatively nonemergent MRI could be performed for further evaluation. Cholelithiasis without complicating factors. Hypodensity within the spleen likely representing a hemangioma but incompletely evaluated on this exam. Diverticulosis without diverticulitis. Electronically Signed   By: Inez Catalina M.D.   On: 12/05/2019 20:53    Procedures Procedures (including critical care time)  Medications Ordered in ED Medications  ondansetron (ZOFRAN) injection 4 mg (4 mg Intravenous Given 12/05/19 1919)  HYDROmorphone (DILAUDID) injection 1 mg (1 mg Intravenous Given 12/05/19 1919)   pantoprazole (PROTONIX) injection 40 mg (40 mg Intravenous Given 12/05/19 1919)  iohexol (OMNIPAQUE) 300 MG/ML solution 100 mL (100 mLs Intravenous Contrast Given 12/05/19 2024)    ED Course  I have reviewed the triage vital signs and the nursing notes.  Pertinent labs &  imaging results that were available during my care of the patient were reviewed by me and considered in my medical decision making (see chart for details).  Clinical Course as of Dec 04 2228  Nancy Fetter Dec 05, 2019  2153 I have given the patient a copy of her CT scan report, she is aware that there are abnormal findings on the ovaries and will follow up.  She will be given gynecology follow-up as well as she does not have a gynecologist only a family doctor at [facility].  Her vital signs have been rechecked, she remains nontachycardic, afebrile, no leukocytosis, blood pressure slightly elevated.  She has complete pain relief after 1 dose of pain medicine and a CT scan that shows no other findings of upper abdominal problems.  She has the abnormal ovarian cyst findings that are concerning for malignancy however she will need to follow-up as an outpatient for that.  The patient is agreeable to the plan   [BM]    Clinical Course User Index [BM] Noemi Chapel, MD   MDM Rules/Calculators/A&P                          This patient is slightly hypertensive at 164/104,.  She has abdominal tenderness which raises concern for choledocholithiasis, gallstone pancreatitis, cholecystitis, peptic ulcer disease, other etiologies as well which would need to hesitate a CT scan to further evaluate.  Labs IV pain medication nausea medicine and CT scan ordered.  Troponin negative, lipase normal, Protonix prescribed, EKG unremarkable, patient stable for discharge, she has aware of her results and has been given a copy of the CT scan, aware that her ovaries are abnormal and referred to gynecology  Final Clinical Impression(s) / ED  Diagnoses Final diagnoses:  Ovarian mass  Epigastric pain    Rx / DC Orders ED Discharge Orders         Ordered    ondansetron (ZOFRAN ODT) 4 MG disintegrating tablet  Every 8 hours PRN        12/05/19 2229    HYDROcodone-acetaminophen (NORCO/VICODIN) 5-325 MG tablet  Every 4 hours PRN        12/05/19 2229    pantoprazole (PROTONIX) 20 MG tablet  Daily        12/05/19 2229           Noemi Chapel, MD 12/05/19 2230

## 2019-12-05 NOTE — ED Notes (Signed)
From CT 

## 2019-12-07 ENCOUNTER — Other Ambulatory Visit: Payer: Self-pay

## 2019-12-07 ENCOUNTER — Encounter: Payer: Self-pay | Admitting: Obstetrics & Gynecology

## 2019-12-07 ENCOUNTER — Ambulatory Visit (INDEPENDENT_AMBULATORY_CARE_PROVIDER_SITE_OTHER): Payer: Medicare (Managed Care) | Admitting: Obstetrics & Gynecology

## 2019-12-07 VITALS — BP 153/94 | HR 88 | Ht 63.0 in | Wt 243.0 lb

## 2019-12-07 DIAGNOSIS — N838 Other noninflammatory disorders of ovary, fallopian tube and broad ligament: Secondary | ICD-10-CM | POA: Diagnosis not present

## 2019-12-07 NOTE — Progress Notes (Signed)
Follow up appointment for results  Chief Complaint  Patient presents with  . Follow-up    New Gyn, [place] [facility]    Blood pressure (!) 153/94, pulse 88, height 5\' 3"  (1.6 m), weight 243 lb (110.2 kg).  Narrative & Impression  CLINICAL DATA:  Epigastric pain and vomiting for several hours  EXAM: CT ABDOMEN AND PELVIS WITH CONTRAST  TECHNIQUE: Multidetector CT imaging of the abdomen and pelvis was performed using the standard protocol following bolus administration of intravenous contrast.  CONTRAST:  117mL OMNIPAQUE IOHEXOL 300 MG/ML  SOLN  COMPARISON:  None.  FINDINGS: Lower chest: No acute abnormality.  Hepatobiliary: Multiple gallstones are noted within a well distended gallbladder. No obstructive changes are seen. Liver is within normal limits. Common bile duct is unremarkable.  Pancreas: Unremarkable. No pancreatic ductal dilatation or surrounding inflammatory changes.  Spleen: Spleen demonstrates a focal hypodensity best seen on image number 28 of series 2 incompletely evaluated on this exam likely representing a hemangioma.  Adrenals/Urinary Tract: Adrenal glands are within normal limits. Kidneys demonstrate 1.6 cm cyst in the upper pole of the left kidney no obstructing stones are seen although evaluation is limited due to significant contrast enhancement within the kidneys at the time of imaging. The bladder is within normal limits.  Stomach/Bowel: Scattered diverticular change of the colon is noted primarily within the sigmoid colon. No obstructive or inflammatory changes are seen. No findings of diverticulitis are noted. The appendix has been surgically removed. The stomach and small bowel appear within normal limits.  Vascular/Lymphatic: No significant vascular findings are present. No enlarged abdominal or pelvic lymph nodes.  Reproductive: Uterus has been surgically removed. To the left of the midline there is somewhat cystic lesion identified  measuring 2.3 cm. A large partially cystic lesion is noted to the right of the midline measuring 7.3 x 6.7 cm with some mixed attenuation within.  Other: No abdominal wall hernia or abnormality. No abdominopelvic ascites.  Musculoskeletal: Degenerative changes of lumbar spine are noted. No other focal abnormality is seen.  IMPRESSION: Bilateral somewhat cystic lesions within the ovaries. Largest of these is noted on the right measuring up to 7.3 cm. Given the size in the postmenopausal state, neoplasm is highly considered. Transvaginal ultrasound of the pelvis may be helpful for further evaluation. Alternatively nonemergent MRI could be performed for further evaluation.  Cholelithiasis without complicating factors.  Hypodensity within the spleen likely representing a hemangioma but incompletely evaluated on this exam.  Diverticulosis without diverticulitis.   Electronically Signed   By: Inez Catalina M.D.   On: 12/05/2019 20:53       MEDS ordered this encounter: No orders of the defined types were placed in this encounter.   Orders for this encounter: Orders Placed This Encounter  Procedures  . US PELVIS (TRANSABDOMINAL ONLY)  . US PELVIS TRANSVAGINAL NON-OB (TV ONLY)  . CA 125    Impression: 1. Ovarian mass, right  - CA 125 - US PELVIS (TRANSABDOMINAL ONLY); Future - US PELVIS TRANSVAGINAL NON-OB (TV ONLY); Future   Plan: Follow up with sonogram and CA 125 results, if has bening appearance will counsel regarding laparoscopic surgery here at [facility] possible with combined lap choley, pt aware of the findings  Follow Up: Return in about 1 week (around 12/14/2019) for GYN sono, Follow up, with Dr Elonda Husky.       Face to face time:  20 minutes  Greater than 50% of the visit time was spent in counseling and coordination of care with  the patient.  The summary and outline of the counseling and care coordination is summarized in the note above.   All  questions were answered.  Past Medical History:  Diagnosis Date  . Acid reflux disease   . Depression   . Diabetes mellitus without complication ([place])   . Goiter   . Hypertension     Past Surgical History:  Procedure Laterality Date  . ABDOMINAL HYSTERECTOMY    . APPENDECTOMY    . BACK SURGERY    . cataracts    . TUBAL LIGATION      OB History    Gravida  1   Para      Term      Preterm      AB      Living  0     SAB      TAB      Ectopic      Multiple      Live Births  1           Allergies  Allergen Reactions  . Trimethoprim Swelling, Rash and Other (See Comments)    Tongue swelling    Social History   Socioeconomic History  . Marital status: Married    Spouse name: Not on file  . Number of children: Not on file  . Years of education: Not on file  . Highest education level: Not on file  Occupational History  . Not on file  Tobacco Use  . Smoking status: Never Smoker  . Smokeless tobacco: Never Used  Vaping Use  . Vaping Use: Never used  Substance and Sexual Activity  . Alcohol use: Never  . Drug use: Never  . Sexual activity: Not Currently    Birth control/protection: Surgical  Other Topics Concern  . Not on file  Social History Narrative  . Not on file   Social Determinants of Health   Financial Resource Strain: Low Risk   . Difficulty of Paying Living Expenses: Not very hard  Food Insecurity: No Food Insecurity  . Worried About Charity fundraiser in the Last Year: Never true  . Ran Out of Food in the Last Year: Never true  Transportation Needs: No Transportation Needs  . Lack of Transportation (Medical): No  . Lack of Transportation (Non-Medical): No  Physical Activity: Unknown  . Days of Exercise per Week: Patient refused  . Minutes of Exercise per Session: Patient refused  Stress: Stress Concern Present  . Feeling of Stress : Very much  Social Connections: Unknown  . Frequency of Communication with Friends and  Family: More than three times a week  . Frequency of Social Gatherings with Friends and Family: More than three times a week  . Attends Religious Services: Patient refused  . Active Member of Clubs or Organizations: No  . Attends Archivist Meetings: Never  . Marital Status: Married    Family History  Problem Relation Age of Onset  . Constipation Mother        died at age 50, "blockage", ended up with colostomy but no cancer  . Breast cancer Mother   . Suicidality Daughter        Died 01-Jan-2017  . Cancer Maternal Grandfather   . Throat cancer Maternal Grandfather   . Cancer Father   . Prostate cancer Father   . Liver cancer Father   . Heart attack Brother   . Stroke Brother   . Prostate cancer Brother   . Colon  cancer Neg Hx

## 2019-12-08 LAB — CA 125: Cancer Antigen (CA) 125: 33.8 U/mL (ref 0.0–38.1)

## 2019-12-14 ENCOUNTER — Ambulatory Visit (HOSPITAL_COMMUNITY): Admission: RE | Admit: 2019-12-14 | Payer: Medicare (Managed Care) | Source: Ambulatory Visit

## 2019-12-16 ENCOUNTER — Ambulatory Visit (INDEPENDENT_AMBULATORY_CARE_PROVIDER_SITE_OTHER): Payer: Medicare (Managed Care)

## 2019-12-16 ENCOUNTER — Ambulatory Visit: Payer: Medicare (Managed Care) | Admitting: Obstetrics & Gynecology

## 2019-12-16 ENCOUNTER — Other Ambulatory Visit: Payer: Medicare (Managed Care)

## 2019-12-16 ENCOUNTER — Ambulatory Visit (INDEPENDENT_AMBULATORY_CARE_PROVIDER_SITE_OTHER): Payer: Medicare (Managed Care) | Admitting: Obstetrics & Gynecology

## 2019-12-16 ENCOUNTER — Other Ambulatory Visit: Payer: Self-pay

## 2019-12-16 ENCOUNTER — Encounter: Payer: Self-pay | Admitting: Obstetrics & Gynecology

## 2019-12-16 DIAGNOSIS — R1900 Intra-abdominal and pelvic swelling, mass and lump, unspecified site: Secondary | ICD-10-CM

## 2019-12-16 DIAGNOSIS — N838 Other noninflammatory disorders of ovary, fallopian tube and broad ligament: Secondary | ICD-10-CM | POA: Diagnosis not present

## 2019-12-16 NOTE — Progress Notes (Signed)
Preoperative History and Physical  Bethany Myers is a 69 y.o. G1P0 with No LMP recorded. Patient has had a hysterectomy. admitted for a laparoscopic removal of both tubes and ovaries and pelvic masses .  Normal CA 125 and appearances most consistent with benign masses pt of course understands could be malignant potentially, low liklihood  PMH:    Past Medical History:  Diagnosis Date  . Acid reflux disease   . Depression   . Diabetes mellitus without complication ([place])   . Goiter   . Hypertension     PSH:     Past Surgical History:  Procedure Laterality Date  . ABDOMINAL HYSTERECTOMY    . APPENDECTOMY    . BACK SURGERY    . cataracts    . TUBAL LIGATION      POb/GynH:      OB History    Gravida  1   Para      Term      Preterm      AB      Living  0     SAB      IAB      Ectopic      Multiple      Live Births  1           SH:   Social History   Tobacco Use  . Smoking status: Never Smoker  . Smokeless tobacco: Never Used  Vaping Use  . Vaping Use: Never used  Substance Use Topics  . Alcohol use: Never  . Drug use: Never    FH:    Family History  Problem Relation Age of Onset  . Constipation Mother        died at age 35, "blockage", ended up with colostomy but no cancer  . Breast cancer Mother   . Suicidality Daughter        Died Jan 07, 2017  . Cancer Maternal Grandfather   . Throat cancer Maternal Grandfather   . Cancer Father   . Prostate cancer Father   . Liver cancer Father   . Heart attack Brother   . Stroke Brother   . Prostate cancer Brother   . Colon cancer Neg Hx      Allergies:  Allergies  Allergen Reactions  . Trimethoprim Swelling, Rash and Other (See Comments)    Tongue swelling    Medications:       Current Outpatient Medications:  .  amLODipine (NORVASC) 5 MG tablet, Take 5 mg by mouth daily. , Disp: , Rfl:  .  cephALEXin (KEFLEX) 500 MG capsule, TAKE 1 CAPSULE BY MOUTH THREE TIMES A DAY, Disp: , Rfl:   .  cyclobenzaprine (FLEXERIL) 10 MG tablet, , Disp: , Rfl:  .  FLUoxetine (PROZAC) 20 MG capsule, , Disp: , Rfl:  .  FLUoxetine (PROZAC) 40 MG capsule, Take 40 mg by mouth daily. , Disp: , Rfl:  .  fluticasone (FLONASE) 50 MCG/ACT nasal spray, Place 2 sprays into both nostrils daily., Disp: , Rfl:  .  hydrochlorothiazide (HYDRODIURIL) 25 MG tablet, Take 25 mg by mouth daily. , Disp: , Rfl:  .  HYDROcodone-acetaminophen (NORCO/VICODIN) 5-325 MG tablet, Take 2 tablets by mouth every 4 (four) hours as needed., Disp: 6 tablet, Rfl: 0 .  ibuprofen (ADVIL,MOTRIN) 800 MG tablet, Take 800 mg by mouth every 6 (six) hours as needed for headache or moderate pain., Disp: , Rfl:  .  levothyroxine (SYNTHROID, LEVOTHROID) 50 MCG tablet, TAKE 1 TABLET BY MOUTH  IN THE MORNING., Disp: 30 tablet, Rfl: 2 .  metFORMIN (GLUCOPHAGE) 500 MG tablet, Take by mouth 2 (two) times daily with a meal., Disp: , Rfl:  .  ondansetron (ZOFRAN ODT) 4 MG disintegrating tablet, Take 1 tablet (4 mg total) by mouth every 8 (eight) hours as needed for nausea., Disp: 10 tablet, Rfl: 0 .  potassium chloride SA (K-DUR) 20 MEQ tablet, , Disp: , Rfl:  .  pravastatin (PRAVACHOL) 20 MG tablet, Take 20 mg by mouth daily., Disp: , Rfl:   Review of Systems:   Review of Systems  Constitutional: Negative for fever, chills, weight loss, malaise/fatigue and diaphoresis.  HENT: Negative for hearing loss, ear pain, nosebleeds, congestion, sore throat, neck pain, tinnitus and ear discharge.   Eyes: Negative for blurred vision, double vision, photophobia, pain, discharge and redness.  Respiratory: Negative for cough, hemoptysis, sputum production, shortness of breath, wheezing and stridor.   Cardiovascular: Negative for chest pain, palpitations, orthopnea, claudication, leg swelling and PND.  Gastrointestinal: Positive for abdominal pain. Negative for heartburn, nausea, vomiting, diarrhea, constipation, blood in stool and melena.  Genitourinary:  Negative for dysuria, urgency, frequency, hematuria and flank pain.  Musculoskeletal: Negative for myalgias, back pain, joint pain and falls.  Skin: Negative for itching and rash.  Neurological: Negative for dizziness, tingling, tremors, sensory change, speech change, focal weakness, seizures, loss of consciousness, weakness and headaches.  Endo/Heme/Allergies: Negative for environmental allergies and polydipsia. Does not bruise/bleed easily.  Psychiatric/Behavioral: Negative for depression, suicidal ideas, hallucinations, memory loss and substance abuse. The patient is not nervous/anxious and does not have insomnia.      PHYSICAL EXAM:  There were no vitals taken for this visit.    Vitals reviewed. Constitutional: She is oriented to person, place, and time. She appears well-developed and well-nourished.  HENT:  Head: Normocephalic and atraumatic.  Right Ear: External ear normal.  Left Ear: External ear normal.  Nose: Nose normal.  Mouth/Throat: Oropharynx is clear and moist.  Eyes: Conjunctivae and EOM are normal. Pupils are equal, round, and reactive to light. Right eye exhibits no discharge. Left eye exhibits no discharge. No scleral icterus.  Neck: Normal range of motion. Neck supple. No tracheal deviation present. No thyromegaly present.  Cardiovascular: Normal rate, regular rhythm, normal heart sounds and intact distal pulses.  Exam reveals no gallop and no friction rub.   No murmur heard. Respiratory: Effort normal and breath sounds normal. No respiratory distress. She has no wheezes. She has no rales. She exhibits no tenderness.  GI: Soft. Bowel sounds are normal. She exhibits no distension and no mass. There is tenderness. There is no rebound and no guarding.  Genitourinary:       Vulva is normal without lesions Vagina is pink moist without discharge Cervix normal in appearance and pap is normal Uterus is uterus absent Adnexa iper sonogram Musculoskeletal: Normal range of  motion. She exhibits no edema and no tenderness.  Neurological: She is alert and oriented to person, place, and time. She has normal reflexes. She displays normal reflexes. No cranial nerve deficit. She exhibits normal muscle tone. Coordination normal.  Skin: Skin is warm and dry. No rash noted. No erythema. No pallor.  Psychiatric: She has a normal mood and affect. Her behavior is normal. Judgment and thought content normal.    Labs: Results for orders placed or performed in visit on 12/07/19 (from the past 336 hour(s))  CA 125   Collection Time: 12/07/19 12:15 PM  Result Value Ref Range  Cancer Antigen (CA) 125 33.8 0.0 - 38.1 U/mL  Results for orders placed or performed during the hospital encounter of 12/05/19 (from the past 336 hour(s))  Lipase, blood   Collection Time: 12/05/19  7:15 PM  Result Value Ref Range   Lipase 40 11 - 51 U/L  Comprehensive metabolic panel   Collection Time: 12/05/19  7:15 PM  Result Value Ref Range   Sodium 136 135 - 145 mmol/L   Potassium 3.4 (L) 3.5 - 5.1 mmol/L   Chloride 98 98 - 111 mmol/L   CO2 27 22 - 32 mmol/L   Glucose, Bld 172 (H) 70 - 99 mg/dL   BUN 12 8 - 23 mg/dL   Creatinine, Ser 0.89 0.44 - 1.00 mg/dL   Calcium 9.4 8.9 - 10.3 mg/dL   Total Protein 8.7 (H) 6.5 - 8.1 g/dL   Albumin 3.6 3.5 - 5.0 g/dL   AST 24 15 - 41 U/L   ALT 25 0 - 44 U/L   Alkaline Phosphatase 91 38 - 126 U/L   Total Bilirubin 0.3 0.3 - 1.2 mg/dL   GFR, Estimated >60 >60 mL/min   Anion gap 11 5 - 15  CBC with Differential/Platelet   Collection Time: 12/05/19  7:15 PM  Result Value Ref Range   WBC 6.9 4.0 - 10.5 K/uL   RBC 4.64 3.87 - 5.11 MIL/uL   Hemoglobin 13.4 12.0 - 15.0 g/dL   HCT 41.4 36.0 - 46.0 %   MCV 89.2 80.0 - 100.0 fL   MCH 28.9 26.0 - 34.0 pg   MCHC 32.4 30.0 - 36.0 g/dL   RDW 13.7 11.5 - 15.5 %   Platelets 250 150 - 400 K/uL   nRBC 0.0 0.0 - 0.2 %   Neutrophils Relative % 59 %   Neutro Abs 4.2 1.7 - 7.7 K/uL   Lymphocytes Relative 29 %    Lymphs Abs 2.0 0.7 - 4.0 K/uL   Monocytes Relative 5 %   Monocytes Absolute 0.3 0.1 - 1.0 K/uL   Eosinophils Relative 5 %   Eosinophils Absolute 0.4 0.0 - 0.5 K/uL   Basophils Relative 1 %   Basophils Absolute 0.1 0.0 - 0.1 K/uL   Immature Granulocytes 1 %   Abs Immature Granulocytes 0.06 0.00 - 0.07 K/uL  Troponin I (High Sensitivity)   Collection Time: 12/05/19  9:33 PM  Result Value Ref Range   Troponin I (High Sensitivity) 5 <18 ng/L  Urinalysis, Routine w reflex microscopic Urine, Clean Catch   Collection Time: 12/05/19  9:35 PM  Result Value Ref Range   Color, Urine YELLOW YELLOW   APPearance CLEAR CLEAR   Specific Gravity, Urine 1.041 (H) 1.005 - 1.030   pH 7.0 5.0 - 8.0   Glucose, UA NEGATIVE NEGATIVE mg/dL   Hgb urine dipstick NEGATIVE NEGATIVE   Bilirubin Urine NEGATIVE NEGATIVE   Ketones, ur NEGATIVE NEGATIVE mg/dL   Protein, ur NEGATIVE NEGATIVE mg/dL   Nitrite NEGATIVE NEGATIVE   Leukocytes,Ua NEGATIVE NEGATIVE    EKG: Orders placed or performed during the hospital encounter of 12/05/19  . EKG 12-Lead  . EKG 12-Lead  . EKG 12-Lead  . EKG 12-Lead  . EKG    Imaging Studies: CT ABDOMEN PELVIS W CONTRAST  Result Date: 12/05/2019 CLINICAL DATA:  Epigastric pain and vomiting for several hours EXAM: CT ABDOMEN AND PELVIS WITH CONTRAST TECHNIQUE: Multidetector CT imaging of the abdomen and pelvis was performed using the standard protocol following bolus administration of intravenous contrast. CONTRAST:  158mL OMNIPAQUE IOHEXOL 300 MG/ML  SOLN COMPARISON:  None. FINDINGS: Lower chest: No acute abnormality. Hepatobiliary: Multiple gallstones are noted within a well distended gallbladder. No obstructive changes are seen. Liver is within normal limits. Common bile duct is unremarkable. Pancreas: Unremarkable. No pancreatic ductal dilatation or surrounding inflammatory changes. Spleen: Spleen demonstrates a focal hypodensity best seen on image number 28 of series 2  incompletely evaluated on this exam likely representing a hemangioma. Adrenals/Urinary Tract: Adrenal glands are within normal limits. Kidneys demonstrate 1.6 cm cyst in the upper pole of the left kidney no obstructing stones are seen although evaluation is limited due to significant contrast enhancement within the kidneys at the time of imaging. The bladder is within normal limits. Stomach/Bowel: Scattered diverticular change of the colon is noted primarily within the sigmoid colon. No obstructive or inflammatory changes are seen. No findings of diverticulitis are noted. The appendix has been surgically removed. The stomach and small bowel appear within normal limits. Vascular/Lymphatic: No significant vascular findings are present. No enlarged abdominal or pelvic lymph nodes. Reproductive: Uterus has been surgically removed. To the left of the midline there is somewhat cystic lesion identified measuring 2.3 cm. A large partially cystic lesion is noted to the right of the midline measuring 7.3 x 6.7 cm with some mixed attenuation within. Other: No abdominal wall hernia or abnormality. No abdominopelvic ascites. Musculoskeletal: Degenerative changes of lumbar spine are noted. No other focal abnormality is seen. IMPRESSION: Bilateral somewhat cystic lesions within the ovaries. Largest of these is noted on the right measuring up to 7.3 cm. Given the size in the postmenopausal state, neoplasm is highly considered. Transvaginal ultrasound of the pelvis may be helpful for further evaluation. Alternatively nonemergent MRI could be performed for further evaluation. Cholelithiasis without complicating factors. Hypodensity within the spleen likely representing a hemangioma but incompletely evaluated on this exam. Diverticulosis without diverticulitis. Electronically Signed   By: Inez Catalina M.D.   On: 12/05/2019 20:53      Assessment: Bilateral pelvic masses, probably ovarian in origin  Patient Active Problem List    Diagnosis Date Noted  . Heme positive stool 05/07/2017    Plan: Laparoscopic removal of bilateral pelvic masses, appear benign, probable ovarian in origin  Florian Buff 12/16/2019 12:42 PM

## 2019-12-16 NOTE — Progress Notes (Addendum)
PELVIC US TA/TV: normal vaginal cuff,7 x 6.9 x 6.4 cm complex right adnexa cyst with multiple thin septations and  3.4 x 3.3 x 2.3 cm irregular,solid, heterogeneous papillary projection,no color flow,left adnexal cyst with mult thin septations 2.8 x 2.2 x 2.7 cm,no color flow,no free fluid,no separate ovary visualized,pain during ultrasound  Chaperone Marcie Bal

## 2020-01-18 ENCOUNTER — Encounter: Payer: Self-pay | Admitting: General Surgery

## 2020-01-18 ENCOUNTER — Ambulatory Visit: Payer: Medicare HMO | Admitting: General Surgery

## 2020-01-18 ENCOUNTER — Other Ambulatory Visit: Payer: Self-pay

## 2020-01-18 VITALS — BP 147/93 | HR 94 | Temp 98.2°F | Resp 16 | Ht 63.0 in | Wt 236.0 lb

## 2020-01-18 DIAGNOSIS — K8020 Calculus of gallbladder without cholecystitis without obstruction: Secondary | ICD-10-CM

## 2020-01-18 NOTE — Patient Instructions (Signed)
Cholelithiasis  Cholelithiasis is a disease in which gallstones form in the gallbladder. The gallbladder is an organ that stores bile. Bile is a fluid that helps to digest fats. Gallstones begin as small crystals and can slowly grow into stones. They may cause no symptoms until they block the gallbladder duct, or cystic duct, when the gallbladder tightens (contracts) after food is eaten. This can cause pain and is known as a gallbladder attack, or biliary colic. There are two main types of gallstones:  Cholesterol stones. These are the most common type of gallstone. These stones are made of hardened cholesterol and are usually yellow-green in color. Cholesterol is a fat-like substance that is made in the liver.  Pigment stones. These are dark in color and are made of a red-yellow substance, called bilirubin,that forms when hemoglobin from red blood cells breaks down. What are the causes? This condition may be caused by an imbalance in the different parts that make bile. This can happen if the bile:  Has too much bilirubin. This can happen in certain blood diseases, such as sickle cell anemia.  Has too much cholesterol.  Does not have enough bile salts. These salts help the body absorb and digest fats. In some cases, this condition can also be caused by the gallbladder not emptying completely or often enough. This is common during pregnancy. What increases the risk? The following factors may make you more likely to develop this condition:  Being female.  Having multiple pregnancies. Health care providers sometimes advise removing diseased gallbladders before future pregnancies.  Eating a diet that is heavy in fried foods, fat, and refined carbohydrates, such as white bread and white rice.  Being obese.  Being older than age 40.  Using medicines that contain female hormones (estrogen) for a long time.  Losing weight quickly.  Having a family history of gallstones.  Having certain  medical problems, such as: ? Diabetes mellitus. ? Cystic fibrosis. ? Crohn's disease. ? Cirrhosis or other long-term (chronic) liver disease. ? Certain blood diseases, such as sickle cell anemia or leukemia. What are the signs or symptoms? In many cases, having gallstones causes no symptoms. When you have gallstones but do not have symptoms, you have silent gallstones. If a gallstone blocks your bile duct, it can cause a gallbladder attack. The main symptom of a gallbladder attack is sudden pain in the upper right part of the abdomen. The pain:  Usually comes at night or after eating.  Can last for one hour or more.  Can spread to your right shoulder, back, or chest.  Can feel like indigestion. This is discomfort, burning, or fullness in your upper abdomen. If the bile duct is blocked for more than a few hours, it can cause an infection or inflammation of your gallbladder (cholecystitis), liver, or pancreas. This can cause:  Nausea or vomiting.  Bloating.  Pain in your abdomen that lasts for 5 hours or longer.  Tenderness in your upper abdomen, often in the upper right section and under your rib cage.  Fever or chills.  Skin or the white parts of your eyes turning yellow (jaundice). This usually happens when a stone has blocked bile from passing through the common bile duct.  Dark urine or light-colored stools. How is this diagnosed? This condition may be diagnosed based on:  A physical exam.  Your medical history.  Ultrasound.  CT scan.  MRI. You may also have other tests, including:  Blood tests to check for signs of an   infection or inflammation.  Cholescintigraphy, or HIDA scan. This is a scan of your gallbladder and bile ducts (biliary system) using non-harmful radioactive material and special cameras that can see the radioactive material.  Endoscopic retrograde cholangiopancreatogram. This involves inserting a small tube with a camera on the end (endoscope)  through your mouth to look at bile ducts and check for blockages. How is this treated? Treatment for this condition depends on the severity of the condition. Silent gallstones do not need treatment. Treatment may be needed if a blockage causes a gallbladder attack or other symptoms. Treatment may include:  Home care, if symptoms are not severe. ? During a simple gallbladder attack, stop eating and drinking for 12-24 hours (except for water and clear liquids). This helps to "cool down" your gallbladder. After 1 or 2 days, you can start to eat a diet of simple or clear foods, such as broths and crackers. ? You may also need medicines for pain or nausea or both. ? If you have cholecystitis and an infection, you will need antibiotics.  A hospital stay, if needed for pain control or for cholecystitis with severe infection.  Cholecystectomy, or surgery to remove your gallbladder. This is the most common treatment if all other treatments have not worked.  Medicines to break up gallstones. These are most effective at treating small gallstones. Medicines may be used for up to 6-12 months.  Endoscopic retrograde cholangiopancreatogram. A small basket can be attached to the endoscope and used to capture and remove gallstones, mainly those that are in the common bile duct. Follow these instructions at home: Medicines  Take over-the-counter and prescription medicines only as told by your health care provider.  If you were prescribed an antibiotic medicine, take it as told by your health care provider. Do not stop taking the antibiotic even if you start to feel better.  Ask your health care provider if the medicine prescribed to you requires you to avoid driving or using machinery. Eating and drinking  Drink enough fluid to keep your urine pale yellow. This is important during a gallbladder attack. Water and clear liquids are preferred.  Follow a healthy diet. This includes: ? Reducing fatty foods,  such as fried food and foods high in cholesterol. ? Reducing refined carbohydrates, such as white bread and white rice. ? Eating more fiber. Aim for foods such as almonds, fruit, and beans. Alcohol use  If you drink alcohol: ? Limit how much you use to:  0-1 drink a day for nonpregnant women.  0-2 drinks a day for men. ? Be aware of how much alcohol is in your drink. In the [place] one drink equals one 12 oz bottle of beer (355 mL), one 5 oz glass of wine (148 mL), or one 1 oz glass of hard liquor (44 mL). General instructions  Do not use any products that contain nicotine or tobacco, such as cigarettes, e-cigarettes, and chewing tobacco. If you need help quitting, ask your health care provider.  Maintain a healthy weight.  Keep all follow-up visits as told by your health care provider. These may include consultations with a surgeon or specialist. This is important. Where to find more information  National Institute of Diabetes and Digestive and Kidney Diseases: www.niddk.nih.gov Contact a health care provider if:  You think you have had a gallbladder attack.  You have been diagnosed with silent gallstones and you develop pain in your abdomen or indigestion.  You begin to have attacks more often.  You have   dark urine or light-colored stools. Get help right away if:  You have pain from a gallbladder attack that lasts for more than 2 hours.  You have pain in your abdomen that lasts for more than 5 hours or is getting worse.  You have a fever or chills.  You have nausea and vomiting that do not go away.  You develop jaundice. Summary  Cholelithiasis is a disease in which gallstones form in the gallbladder.  This condition may be caused by an imbalance in the different parts that make bile. This can happen if your bile has too much bilirubin or cholesterol, or does not have enough bile salts.  Treatment for gallstones depends on the severity of the condition. Silent  gallstones do not need treatment.  If gallstones cause a gallbladder attack or other symptoms, treatment usually involves not eating or drinking anything. Treatment may also include pain medicines and antibiotics, and it sometimes includes a hospital stay.  Surgery to remove the gallbladder is common if all other treatments have not worked. This information is not intended to replace advice given to you by your health care provider. Make sure you discuss any questions you have with your health care provider. Document Revised: 11/16/2018 Document Reviewed: 11/16/2018 Elsevier Patient Education  2021 Else[employer] Inc.   Minimally Invasive Cholecystectomy Minimally invasive cholecystectomy is surgery to remove the gallbladder. The gallbladder is a pear-shaped organ that lies beneath the liver on the right side of the body. The gallbladder stores bile, which is a fluid that helps the body digest fats. Cholecystectomy is often done to treat inflammation of the gallbladder (cholecystitis). This condition is usually caused by a buildup of gallstones (cholelithiasis) in the gallbladder. Gallstones can block the flow of bile, which can result in inflammation and pain. In severe cases, emergency surgery may be required. This procedure is done though small incisions in the abdomen, instead of one large incision. It is also called laparoscopic surgery. A thin scope with a camera (laparoscope) is inserted through one incision. Then surgical instruments are inserted through the other incisions. In some cases, a minimally invasive surgery may need to be changed to a surgery that is done through a larger incision. This is called open surgery. Tell a health care provider about:  Any allergies you have.  All medicines you are taking, including vitamins, herbs, eye drops, creams, and over-the-counter medicines.  Any problems you or family members have had with anesthetic medicines.  Any blood disorders you  have.  Any surgeries you have had.  Any medical conditions you have.  Whether you are pregnant or may be pregnant. What are the risks? Generally, this is a safe procedure. However, problems may occur, including:  Infection.  Bleeding.  Allergic reactions to medicines.  Damage to nearby structures or organs.  A stone remaining in the common bile duct. The common bile duct carries bile from the gallbladder into the small intestine.  A bile leak from the cyst duct that is clipped when your gallbladder is removed. Medicines Ask your health care provider about:  Changing or stopping your regular medicines. This is especially important if you are taking diabetes medicines or blood thinners.  Taking medicines such as aspirin and ibuprofen. These medicines can thin your blood. Do not take these medicines unless your health care provider tells you to take them.  Taking over-the-counter medicines, vitamins, herbs, and supplements. General instructions  Let your health care provider know if you develop a cold or an infection before   surgery.  Plan to have someone take you home from the hospital or clinic.  If you will be going home right after the procedure, plan to have someone with you for 24 hours.  Ask your health care provider: ? How your surgery site will be marked. ? What steps will be taken to help prevent infection. These may include:  Removing hair at the surgery site.  Washing skin with a germ-killing soap.  Taking antibiotic medicine. What happens during the procedure?  An IV will be inserted into one of your veins.  You will be given one or both of the following: ? A medicine to help you relax (sedative). ? A medicine to make you fall asleep (general anesthetic).  A breathing tube will be placed in your mouth.  Your surgeon will make several small incisions in your abdomen.  The laparoscope will be inserted through one of the small incisions. The camera on  the laparoscope will send images to a monitor in the operating room. This lets your surgeon see inside your abdomen.  A gas will be pumped into your abdomen. This will expand your abdomen to give the surgeon more room to perform the surgery.  Other tools that are needed for the procedure will be inserted through the other incisions. The gallbladder will be removed through one of the incisions.  Your common bile duct may be examined. If stones are found in the common bile duct, they may be removed.  After your gallbladder has been removed, the incisions will be closed with stitches (sutures), staples, or skin glue.  Your incisions may be covered with a bandage (dressing). The procedure may vary among health care providers and hospitals.   What happens after the procedure?  Your blood pressure, heart rate, breathing rate, and blood oxygen level will be monitored until you leave the hospital or clinic.  You will be given medicines as needed to control your pain.  If you were given a sedative during the procedure, it can affect you for several hours. Do not drive or operate machinery until your health care provider says that it is safe. Summary  Minimally invasive cholecystectomy, also called laparoscopic cholecystectomy, is surgery to remove the gallbladder using small incisions.  Tell your health care provider about all the medical conditions you have and all the medicines you are taking for those conditions.  Before the procedure, follow instructions about eating or drinking restrictions and changing or stopping medicines.  If you were given a sedative during the procedure, it can affect you for several hours. Do not drive or operate machinery until your health care provider says that it is safe. This information is not intended to replace advice given to you by your health care provider. Make sure you discuss any questions you have with your health care provider. Document Revised:  09/28/2018 Document Reviewed: 09/28/2018 Elsevier Patient Education  2021 Elsevier Inc.   

## 2020-01-18 NOTE — H&P (Signed)
[facility] History and Physical  Reason for Referral: Gallstones  Referring Physician:  Dr. Elonda Husky  Chief Complaint    New Patient (Initial Visit)      Bethany Myers is a 70 y.o. female.  HPI: Bethany Myers is a 70 yo who went to the ED 11/2019 and had a CT that demonstrated gallstones without signs of cholecystitis but also revealed a ovarian mass that she was referred to see Dr. Elonda Husky. He thinks that this is going to be benign and is planning for a laparoscopic oophorectomy. He had referred her to discuss cholecystectomy at the same time due to continued epigastric/ RUQ pain and associated nausea and vomiting.  She reports having continued pain and some nausea since that time.   Past Medical History:  Diagnosis Date  . Acid reflux disease   . Depression   . Diabetes mellitus without complication ([place])   . Goiter   . Hypertension     Past Surgical History:  Procedure Laterality Date  . ABDOMINAL HYSTERECTOMY    . APPENDECTOMY    . BACK SURGERY    . cataracts    . TUBAL LIGATION      Family History  Problem Relation Age of Onset  . Constipation Mother        died at age 24, "blockage", ended up with colostomy but no cancer  . Breast cancer Mother   . Suicidality Daughter        Died 12/16/2016  . Cancer Maternal Grandfather   . Throat cancer Maternal Grandfather   . Cancer Father   . Prostate cancer Father   . Liver cancer Father   . Heart attack Brother   . Stroke Brother   . Prostate cancer Brother   . Colon cancer Neg Hx     Social History   Tobacco Use  . Smoking status: Never Smoker  . Smokeless tobacco: Never Used  Vaping Use  . Vaping Use: Never used  Substance Use Topics  . Alcohol use: Never  . Drug use: Never    Medications: I have reviewed the patient's current medications. Allergies as of 01/18/2020      Reactions   Trimethoprim Swelling, Rash, Other (See Comments)   Tongue swelling      Medication List        Accurate as of January 18, 2020  9:16 AM. If you have any questions, ask your nurse or doctor.        STOP taking these medications   cephALEXin 500 MG capsule Commonly known as: KEFLEX Stopped by: Virl Cagey, MD     TAKE these medications   amLODipine 5 MG tablet Commonly known as: NORVASC Take 5 mg by mouth daily.   cyclobenzaprine 10 MG tablet Commonly known as: FLEXERIL   FLUoxetine 40 MG capsule Commonly known as: PROZAC Take 40 mg by mouth daily.   FLUoxetine 20 MG capsule Commonly known as: PROZAC   fluticasone 50 MCG/ACT nasal spray Commonly known as: FLONASE Place 2 sprays into both nostrils daily.   hydrochlorothiazide 25 MG tablet Commonly known as: HYDRODIURIL Take 25 mg by mouth daily.   HYDROcodone-acetaminophen 5-325 MG tablet Commonly known as: NORCO/VICODIN Take 2 tablets by mouth every 4 (four) hours as needed.   ibuprofen 800 MG tablet Commonly known as: ADVIL Take 800 mg by mouth every 6 (six) hours as needed for headache or moderate pain.   levothyroxine 50 MCG tablet Commonly known as: SYNTHROID TAKE 1 TABLET BY  MOUTH IN THE MORNING.   metFORMIN 500 MG tablet Commonly known as: GLUCOPHAGE Take by mouth 2 (two) times daily with a meal.   ondansetron 4 MG disintegrating tablet Commonly known as: Zofran ODT Take 1 tablet (4 mg total) by mouth every 8 (eight) hours as needed for nausea.   potassium chloride SA 20 MEQ tablet Commonly known as: KLOR-CON   pravastatin 20 MG tablet Commonly known as: PRAVACHOL Take 20 mg by mouth daily.        ROS:  A comprehensive review of systems was negative except for: Gastrointestinal: positive for abdominal pain and nausea Musculoskeletal: positive for back pain and joint pain  Blood pressure (!) 147/93, pulse 94, temperature 98.2 F (36.8 C), temperature source Oral, resp. rate 16, height 5\' 3"  (1.6 m), weight 236 lb (107 kg), SpO2 96 %. Physical Exam Vitals reviewed.  HENT:      Head: Normocephalic.     Nose: Nose normal.     Mouth/Throat:     Mouth: Mucous membranes are moist.  Eyes:     Extraocular Movements: Extraocular movements intact.  Cardiovascular:     Rate and Rhythm: Normal rate and regular rhythm.  Pulmonary:     Effort: Pulmonary effort is normal.     Breath sounds: Normal breath sounds.  Abdominal:     General: There is no distension.     Palpations: Abdomen is soft.     Tenderness: There is abdominal tenderness in the right upper quadrant.  Musculoskeletal:        General: Normal range of motion.     Cervical back: Normal range of motion.  Skin:    General: Skin is warm.  Neurological:     General: No focal deficit present.     Mental Status: She is alert and oriented to person, place, and time.  Psychiatric:        Mood and Affect: Mood normal.        Behavior: Behavior normal.        Thought Content: Thought content normal.        Judgment: Judgment normal.     Results: Large gallstones without any signs of inflammation or swelling  CLINICAL DATA:  Epigastric pain and vomiting for several hours  EXAM: CT ABDOMEN AND PELVIS WITH CONTRAST  TECHNIQUE: Multidetector CT imaging of the abdomen and pelvis was performed using the standard protocol following bolus administration of intravenous contrast.  CONTRAST:  170mL OMNIPAQUE IOHEXOL 300 MG/ML  SOLN  COMPARISON:  None.  FINDINGS: Lower chest: No acute abnormality.  Hepatobiliary: Multiple gallstones are noted within a well distended gallbladder. No obstructive changes are seen. Liver is within normal limits. Common bile duct is unremarkable.  Pancreas: Unremarkable. No pancreatic ductal dilatation or surrounding inflammatory changes.  Spleen: Spleen demonstrates a focal hypodensity best seen on image number 28 of series 2 incompletely evaluated on this exam likely representing a hemangioma.  Adrenals/Urinary Tract: Adrenal glands are within normal  limits. Kidneys demonstrate 1.6 cm cyst in the upper pole of the left kidney no obstructing stones are seen although evaluation is limited due to significant contrast enhancement within the kidneys at the time of imaging. The bladder is within normal limits.  Stomach/Bowel: Scattered diverticular change of the colon is noted primarily within the sigmoid colon. No obstructive or inflammatory changes are seen. No findings of diverticulitis are noted. The appendix has been surgically removed. The stomach and small bowel appear within normal limits.  Vascular/Lymphatic: No significant vascular findings  are present. No enlarged abdominal or pelvic lymph nodes.  Reproductive: Uterus has been surgically removed. To the left of the midline there is somewhat cystic lesion identified measuring 2.3 cm. A large partially cystic lesion is noted to the right of the midline measuring 7.3 x 6.7 cm with some mixed attenuation within.  Other: No abdominal wall hernia or abnormality. No abdominopelvic ascites.  Musculoskeletal: Degenerative changes of lumbar spine are noted. No other focal abnormality is seen.  IMPRESSION: Bilateral somewhat cystic lesions within the ovaries. Largest of these is noted on the right measuring up to 7.3 cm. Given the size in the postmenopausal state, neoplasm is highly considered. Transvaginal ultrasound of the pelvis may be helpful for further evaluation. Alternatively nonemergent MRI could be performed for further evaluation.  Cholelithiasis without complicating factors.  Hypodensity within the spleen likely representing a hemangioma but incompletely evaluated on this exam.  Diverticulosis without diverticulitis.   Electronically Signed   By: Inez Catalina M.D.   On: 12/05/2019 20:53  Assessment & Plan:  Bethany Myers is a 70 y.o. female with symptomatic cholelithiasis and ovarian mass. She is undergoing surgery with Dr. Elonda Husky and will get her  cholecystectomy at the same time.   PLAN: I counseled the patient about the indication, risks and benefits of laparoscopic cholecystectomy.  She understands there is a very small chance for bleeding, infection, injury to normal structures (including common bile duct), conversion to open surgery, persistent symptoms, evolution of postcholecystectomy diarrhea, need for secondary interventions, anesthesia reaction, cardiopulmonary issues and other risks not specifically detailed here. I described the expected recovery, the plan for follow-up and the restrictions during the recovery phase.  All questions were answered.  Discussed preop testing and COVID testing.   All questions were answered to the satisfaction of the patient.    Virl Cagey 01/18/2020, 9:16 AM

## 2020-01-18 NOTE — Progress Notes (Signed)
[facility] History and Physical  Reason for Referral: Gallstones  Referring Physician:  Dr. Elonda Husky  Chief Complaint    New Patient (Initial Visit)      Bethany Myers is a 70 y.o. female.  HPI: Bethany Myers is a 70 yo who went to the ED 11/2019 and had a CT that demonstrated gallstones without signs of cholecystitis but also revealed a ovarian mass that Bethany Myers was referred to see Dr. Elonda Husky. He thinks that this is going to be benign and is planning for a laparoscopic oophorectomy. He had referred her to discuss cholecystectomy at the same time due to continued epigastric/ RUQ pain and associated nausea and vomiting.  Bethany Myers reports having continued pain and some nausea since that time.   Past Medical History:  Diagnosis Date  . Acid reflux disease   . Depression   . Diabetes mellitus without complication ([place])   . Goiter   . Hypertension     Past Surgical History:  Procedure Laterality Date  . ABDOMINAL HYSTERECTOMY    . APPENDECTOMY    . BACK SURGERY    . cataracts    . TUBAL LIGATION      Family History  Problem Relation Age of Onset  . Constipation Mother        died at age 68, "blockage", ended up with colostomy but no cancer  . Breast cancer Mother   . Suicidality Daughter        Died Dec 29, 2016  . Cancer Maternal Grandfather   . Throat cancer Maternal Grandfather   . Cancer Father   . Prostate cancer Father   . Liver cancer Father   . Heart attack Brother   . Stroke Brother   . Prostate cancer Brother   . Colon cancer Neg Hx     Social History   Tobacco Use  . Smoking status: Never Smoker  . Smokeless tobacco: Never Used  Vaping Use  . Vaping Use: Never used  Substance Use Topics  . Alcohol use: Never  . Drug use: Never    Medications: I have reviewed the patient's current medications. Allergies as of 01/18/2020      Reactions   Trimethoprim Swelling, Rash, Other (See Comments)   Tongue swelling      Medication List        Accurate as of January 18, 2020  9:16 AM. If you have any questions, ask your nurse or doctor.        STOP taking these medications   cephALEXin 500 MG capsule Commonly known as: KEFLEX Stopped by: Virl Cagey, MD     TAKE these medications   amLODipine 5 MG tablet Commonly known as: NORVASC Take 5 mg by mouth daily.   cyclobenzaprine 10 MG tablet Commonly known as: FLEXERIL   FLUoxetine 40 MG capsule Commonly known as: PROZAC Take 40 mg by mouth daily.   FLUoxetine 20 MG capsule Commonly known as: PROZAC   fluticasone 50 MCG/ACT nasal spray Commonly known as: FLONASE Place 2 sprays into both nostrils daily.   hydrochlorothiazide 25 MG tablet Commonly known as: HYDRODIURIL Take 25 mg by mouth daily.   HYDROcodone-acetaminophen 5-325 MG tablet Commonly known as: NORCO/VICODIN Take 2 tablets by mouth every 4 (four) hours as needed.   ibuprofen 800 MG tablet Commonly known as: ADVIL Take 800 mg by mouth every 6 (six) hours as needed for headache or moderate pain.   levothyroxine 50 MCG tablet Commonly known as: SYNTHROID TAKE 1 TABLET BY  MOUTH IN THE MORNING.   metFORMIN 500 MG tablet Commonly known as: GLUCOPHAGE Take by mouth 2 (two) times daily with a meal.   ondansetron 4 MG disintegrating tablet Commonly known as: Zofran ODT Take 1 tablet (4 mg total) by mouth every 8 (eight) hours as needed for nausea.   potassium chloride SA 20 MEQ tablet Commonly known as: KLOR-CON   pravastatin 20 MG tablet Commonly known as: PRAVACHOL Take 20 mg by mouth daily.        ROS:  A comprehensive review of systems was negative except for: Gastrointestinal: positive for abdominal pain and nausea Musculoskeletal: positive for back pain and joint pain  Blood pressure (!) 147/93, pulse 94, temperature 98.2 F (36.8 C), temperature source Oral, resp. rate 16, height 5\' 3"  (1.6 m), weight 236 lb (107 kg), SpO2 96 %. Physical Exam Vitals reviewed.  HENT:      Head: Normocephalic.     Nose: Nose normal.     Mouth/Throat:     Mouth: Mucous membranes are moist.  Eyes:     Extraocular Movements: Extraocular movements intact.  Cardiovascular:     Rate and Rhythm: Normal rate and regular rhythm.  Pulmonary:     Effort: Pulmonary effort is normal.     Breath sounds: Normal breath sounds.  Abdominal:     General: There is no distension.     Palpations: Abdomen is soft.     Tenderness: There is abdominal tenderness in the right upper quadrant.  Musculoskeletal:        General: Normal range of motion.     Cervical back: Normal range of motion.  Skin:    General: Skin is warm.  Neurological:     General: No focal deficit present.     Mental Status: Bethany Myers is alert and oriented to person, place, and time.  Psychiatric:        Mood and Affect: Mood normal.        Behavior: Behavior normal.        Thought Content: Thought content normal.        Judgment: Judgment normal.     Results: Large gallstones without any signs of inflammation or swelling  CLINICAL DATA:  Epigastric pain and vomiting for several hours  EXAM: CT ABDOMEN AND PELVIS WITH CONTRAST  TECHNIQUE: Multidetector CT imaging of the abdomen and pelvis was performed using the standard protocol following bolus administration of intravenous contrast.  CONTRAST:  170mL OMNIPAQUE IOHEXOL 300 MG/ML  SOLN  COMPARISON:  None.  FINDINGS: Lower chest: No acute abnormality.  Hepatobiliary: Multiple gallstones are noted within a well distended gallbladder. No obstructive changes are seen. Liver is within normal limits. Common bile duct is unremarkable.  Pancreas: Unremarkable. No pancreatic ductal dilatation or surrounding inflammatory changes.  Spleen: Spleen demonstrates a focal hypodensity best seen on image number 28 of series 2 incompletely evaluated on this exam likely representing a hemangioma.  Adrenals/Urinary Tract: Adrenal glands are within normal  limits. Kidneys demonstrate 1.6 cm cyst in the upper pole of the left kidney no obstructing stones are seen although evaluation is limited due to significant contrast enhancement within the kidneys at the time of imaging. The bladder is within normal limits.  Stomach/Bowel: Scattered diverticular change of the colon is noted primarily within the sigmoid colon. No obstructive or inflammatory changes are seen. No findings of diverticulitis are noted. The appendix has been surgically removed. The stomach and small bowel appear within normal limits.  Vascular/Lymphatic: No significant vascular findings  are present. No enlarged abdominal or pelvic lymph nodes.  Reproductive: Uterus has been surgically removed. To the left of the midline there is somewhat cystic lesion identified measuring 2.3 cm. A large partially cystic lesion is noted to the right of the midline measuring 7.3 x 6.7 cm with some mixed attenuation within.  Other: No abdominal wall hernia or abnormality. No abdominopelvic ascites.  Musculoskeletal: Degenerative changes of lumbar spine are noted. No other focal abnormality is seen.  IMPRESSION: Bilateral somewhat cystic lesions within the ovaries. Largest of these is noted on the right measuring up to 7.3 cm. Given the size in the postmenopausal state, neoplasm is highly considered. Transvaginal ultrasound of the pelvis may be helpful for further evaluation. Alternatively nonemergent MRI could be performed for further evaluation.  Cholelithiasis without complicating factors.  Hypodensity within the spleen likely representing a hemangioma but incompletely evaluated on this exam.  Diverticulosis without diverticulitis.   Electronically Signed   By: Inez Catalina M.D.   On: 12/05/2019 20:53  Assessment & Plan:  NERIYAH POUPARD is a 70 y.o. female with symptomatic cholelithiasis and ovarian mass. Bethany Myers is undergoing surgery with Dr. Elonda Husky and will get her  cholecystectomy at the same time.   PLAN: I counseled the patient about the indication, risks and benefits of laparoscopic cholecystectomy.  Bethany Myers understands there is a very small chance for bleeding, infection, injury to normal structures (including common bile duct), conversion to open surgery, persistent symptoms, evolution of postcholecystectomy diarrhea, need for secondary interventions, anesthesia reaction, cardiopulmonary issues and other risks not specifically detailed here. I described the expected recovery, the plan for follow-up and the restrictions during the recovery phase.  All questions were answered.  Discussed preop testing and COVID testing.   All questions were answered to the satisfaction of the patient.    Virl Cagey 01/18/2020, 9:16 AM

## 2020-01-21 NOTE — Patient Instructions (Signed)
Bethany Myers  01/21/2020     @PREFPERIOPPHARMACY @   Your procedure is scheduled on  01/26/2020.  Report to [facility] at  1020  A.M.  Call this number if you have problems the morning of surgery:  [phone]   Remember:  Do not eat or drink after midnight.                       Take these medicines the morning of surgery with A SIP OF WATER  Amlodipine, prozac, hydrocodone(if needed), levothyroxine, zofran(if needed).    Do not wear jewelry, make-up or nail polish.  Do not wear lotions, powders, or perfumes, or deodorant. Please brush your teeth.  Do not shave 48 hours prior to surgery.  Men may shave face and neck.  Do not bring valuables to the hospital.  [facility] is not responsible for any belongings or valuables.  Contacts, dentures or bridgework may not be worn into surgery.  Leave your suitcase in the car.  After surgery it may be brought to your room.  For patients admitted to the hospital, discharge time will be determined by your treatment team.  Patients discharged the day of surgery will not be allowed to drive home.   Name and phone number of your driver:   Family   Special instructions:  DO NOT smoke the morning of your procedure.  Please read over the following fact sheets that you were given. Anesthesia Post-op Instructions and Care and Recovery After Surgery       Bilateral Salpingo-Oophorectomy, Care After This sheet gives you information about how to care for yourself after your procedure. Your health care provider may also give you more specific instructions. If you have problems or questions, contact your health care provider. What can I expect after the procedure? After the procedure, it is common to have:  Abdominal pain.  Some occasional vaginal bleeding (spotting).  Tiredness.  Symptoms of menopause, such as hot flashes, night sweats, or mood swings. Follow these instructions at home: Incision care  Keep your incision  area and your bandage (dressing) clean and dry.  Follow instructions from your health care provider about how to take care of your incision. Make sure you: ? Wash your hands with soap and water before you change your dressing. If soap and water are not available, use hand sanitizer. ? Change your dressing as told by your health care provider. ? Leave stitches (sutures), staples, skin glue, or adhesive strips in place. These skin closures may need to stay in place for 2 weeks or longer. If adhesive strip edges start to loosen and curl up, you may trim the loose edges. Do not remove adhesive strips completely unless your health care provider tells you to do that.  Check your incision area every day for signs of infection. Check for: ? Redness, swelling, or pain. ? Fluid or blood. ? Warmth. ? Pus or a bad smell.   Activity  Do not drive or use heavy machinery while taking prescription pain medicine.  Do not drive for 24 hours if you received a medicine to help you relax (sedative) during your procedure.  Take frequent, short walks throughout the day. Rest when you get tired. Ask your health care provider what activities are safe for you.  Avoid activity that requires great effort. Also, avoid heavy lifting. Do not lift anything that is heavier than 10 lbs. (4.5 kg), or the limit  that your health care provider tells you, until he or she says that it is safe to do so.  Do not douche, use tampons, or have sex until your health care provider approves.   General instructions  To prevent or treat constipation while you are taking prescription pain medicine, your health care provider may recommend that you: ? Drink enough fluid to keep your urine clear or pale yellow. ? Take over-the-counter or prescription medicines. ? Eat foods that are high in fiber, such as fresh fruits and vegetables, whole grains, and beans. ? Limit foods that are high in fat and processed sugars, such as fried and sweet  foods.  Take over-the-counter and prescription medicines only as told by your health care provider.  Do not take baths, swim, or use a hot tub until your health care provider approves. Ask your health care provider if you can take showers. You may only be allowed to take sponge baths for bathing.  Wear compression stockings as told by your health care provider. These stockings help to prevent blood clots and reduce swelling in your legs.  Keep all follow-up visits as told by your health care provider. This is important.   Contact a health care provider if:  You have pain when you urinate.  You have pus or a bad smelling discharge coming from your vagina.  You have redness, swelling, or pain around your incision.  You have fluid or blood coming from your incision.  Your incision feels warm to the touch.  You have pus or a bad smell coming from your incision.  You have a fever.  Your incision starts to break open.  You have pain in the abdomen, and it gets worse or does not get better when you take medicine.  You develop a rash.  You develop nausea and vomiting.  You feel lightheaded. Get help right away if:  You develop pain in your chest or leg.  You become short of breath.  You faint.  You have increased bleeding from your vagina. Summary  After the procedure, it is common to have pain, bleeding in the vagina, and symptoms of menopause.  Follow instructions from your health care provider about how to take care of your incision.  Follow instructions from your health care provider about activities and restrictions.  Check your incision every day for signs of infection and report any symptoms to your health care provider. This information is not intended to replace advice given to you by your health care provider. Make sure you discuss any questions you have with your health care provider. Document Revised: 02/27/2018 Document Reviewed: 01/29/2016 Elsevier Patient  Education  2021 [place].  Minimally Invasive Cholecystectomy Minimally invasive cholecystectomy is surgery to remove the gallbladder. The gallbladder is a pear-shaped organ that lies beneath the liver on the right side of the body. The gallbladder stores bile, which is a fluid that helps the body digest fats. Cholecystectomy is often done to treat inflammation of the gallbladder (cholecystitis). This condition is usually caused by a buildup of gallstones (cholelithiasis) in the gallbladder. Gallstones can block the flow of bile, which can result in inflammation and pain. In severe cases, emergency surgery may be required. This procedure is done though small incisions in the abdomen, instead of one large incision. It is also called laparoscopic surgery. A thin scope with a camera (laparoscope) is inserted through one incision. Then surgical instruments are inserted through the other incisions. In some cases, a minimally invasive surgery  may need to be changed to a surgery that is done through a larger incision. This is called open surgery. Tell a health care provider about:  Any allergies you have.  All medicines you are taking, including vitamins, herbs, eye drops, creams, and over-the-counter medicines.  Any problems you or family members have had with anesthetic medicines.  Any blood disorders you have.  Any surgeries you have had.  Any medical conditions you have.  Whether you are pregnant or may be pregnant. What are the risks? Generally, this is a safe procedure. However, problems may occur, including:  Infection.  Bleeding.  Allergic reactions to medicines.  Damage to nearby structures or organs.  A stone remaining in the common bile duct. The common bile duct carries bile from the gallbladder into the small intestine.  A bile leak from the cyst duct that is clipped when your gallbladder is removed. What happens before the procedure? Staying hydrated Follow  instructions from your health care provider about hydration, which may include:  Up to 2 hours before the procedure - you may continue to drink clear liquids, such as water, clear fruit juice, black coffee, and plain tea.   Eating and drinking restrictions Follow instructions from your health care provider about eating and drinking, which may include:  8 hours before the procedure - stop eating heavy meals or foods, such as meat, fried foods, or fatty foods.  6 hours before the procedure - stop eating light meals or foods, such as toast or cereal.  6 hours before the procedure - stop drinking milk or drinks that contain milk.  2 hours before the procedure - stop drinking clear liquids. Medicines Ask your health care provider about:  Changing or stopping your regular medicines. This is especially important if you are taking diabetes medicines or blood thinners.  Taking medicines such as aspirin and ibuprofen. These medicines can thin your blood. Do not take these medicines unless your health care provider tells you to take them.  Taking over-the-counter medicines, vitamins, herbs, and supplements. General instructions  Let your health care provider know if you develop a cold or an infection before surgery.  Plan to have someone take you home from the hospital or clinic.  If you will be going home right after the procedure, plan to have someone with you for 24 hours.  Ask your health care provider: ? How your surgery site will be marked. ? What steps will be taken to help prevent infection. These may include:  Removing hair at the surgery site.  Washing skin with a germ-killing soap.  Taking antibiotic medicine. What happens during the procedure?  An IV will be inserted into one of your veins.  You will be given one or both of the following: ? A medicine to help you relax (sedative). ? A medicine to make you fall asleep (general anesthetic).  A breathing tube will be  placed in your mouth.  Your surgeon will make several small incisions in your abdomen.  The laparoscope will be inserted through one of the small incisions. The camera on the laparoscope will send images to a monitor in the operating room. This lets your surgeon see inside your abdomen.  A gas will be pumped into your abdomen. This will expand your abdomen to give the surgeon more room to perform the surgery.  Other tools that are needed for the procedure will be inserted through the other incisions. The gallbladder will be removed through one of the incisions.  Your common bile duct may be examined. If stones are found in the common bile duct, they may be removed.  After your gallbladder has been removed, the incisions will be closed with stitches (sutures), staples, or skin glue.  Your incisions may be covered with a bandage (dressing). The procedure may vary among health care providers and hospitals.   What happens after the procedure?  Your blood pressure, heart rate, breathing rate, and blood oxygen level will be monitored until you leave the hospital or clinic.  You will be given medicines as needed to control your pain.  If you were given a sedative during the procedure, it can affect you for several hours. Do not drive or operate machinery until your health care provider says that it is safe. Summary  Minimally invasive cholecystectomy, also called laparoscopic cholecystectomy, is surgery to remove the gallbladder using small incisions.  Tell your health care provider about all the medical conditions you have and all the medicines you are taking for those conditions.  Before the procedure, follow instructions about eating or drinking restrictions and changing or stopping medicines.  If you were given a sedative during the procedure, it can affect you for several hours. Do not drive or operate machinery until your health care provider says that it is safe. This information is  not intended to replace advice given to you by your health care provider. Make sure you discuss any questions you have with your health care provider. Document Revised: 09/28/2018 Document Reviewed: 09/28/2018 Elsevier Patient Education  2021 [place] Anesthesia, Adult, Care After This sheet gives you information about how to care for yourself after your procedure. Your health care provider may also give you more specific instructions. If you have problems or questions, contact your health care provider. What can I expect after the procedure? After the procedure, the following side effects are common:  Pain or discomfort at the IV site.  Nausea.  Vomiting.  Sore throat.  Trouble concentrating.  Feeling cold or chills.  Feeling weak or tired.  Sleepiness and fatigue.  Soreness and body aches. These side effects can affect parts of the body that were not involved in surgery. Follow these instructions at home: For the time period you were told by your health care provider:  Rest.  Do not participate in activities where you could fall or become injured.  Do not drive or use machinery.  Do not drink alcohol.  Do not take sleeping pills or medicines that cause drowsiness.  Do not make important decisions or sign legal documents.  Do not take care of children on your own.   Eating and drinking  Follow any instructions from your health care provider about eating or drinking restrictions.  When you feel hungry, start by eating small amounts of foods that are soft and easy to digest (bland), such as toast. Gradually return to your regular diet.  Drink enough fluid to keep your urine pale yellow.  If you vomit, rehydrate by drinking water, juice, or clear broth. General instructions  If you have sleep apnea, surgery and certain medicines can increase your risk for breathing problems. Follow instructions from your health care provider about wearing your sleep  device: ? Anytime you are sleeping, including during daytime naps. ? While taking prescription pain medicines, sleeping medicines, or medicines that make you drowsy.  Have a responsible adult stay with you for the time you are told. It is important to have someone help care for you until  you are awake and alert.  Return to your normal activities as told by your health care provider. Ask your health care provider what activities are safe for you.  Take over-the-counter and prescription medicines only as told by your health care provider.  If you smoke, do not smoke without supervision.  Keep all follow-up visits as told by your health care provider. This is important. Contact a health care provider if:  You have nausea or vomiting that does not get better with medicine.  You cannot eat or drink without vomiting.  You have pain that does not get better with medicine.  You are unable to pass urine.  You develop a skin rash.  You have a fever.  You have redness around your IV site that gets worse. Get help right away if:  You have difficulty breathing.  You have chest pain.  You have blood in your urine or stool, or you vomit blood. Summary  After the procedure, it is common to have a sore throat or nausea. It is also common to feel tired.  Have a responsible adult stay with you for the time you are told. It is important to have someone help care for you until you are awake and alert.  When you feel hungry, start by eating small amounts of foods that are soft and easy to digest (bland), such as toast. Gradually return to your regular diet.  Drink enough fluid to keep your urine pale yellow.  Return to your normal activities as told by your health care provider. Ask your health care provider what activities are safe for you. This information is not intended to replace advice given to you by your health care provider. Make sure you discuss any questions you have with your  health care provider. Document Revised: 09/09/2019 Document Reviewed: 04/08/2019 Elsevier Patient Education  2021 [employer].

## 2020-01-24 ENCOUNTER — Other Ambulatory Visit: Payer: Self-pay | Admitting: Obstetrics & Gynecology

## 2020-01-24 ENCOUNTER — Encounter (HOSPITAL_COMMUNITY)
Admission: RE | Admit: 2020-01-24 | Discharge: 2020-01-24 | Disposition: A | Discharge status: Home / Self Care | Payer: Medicare HMO | Source: Ambulatory Visit | Attending: Obstetrics & Gynecology | Admitting: Obstetrics & Gynecology

## 2020-01-24 NOTE — Patient Instructions (Signed)
Your procedure is scheduled on: 01/26/2020  Report to [facility] at    10:20 AM.  Call this number if you have problems the morning of surgery: [phone]   Remember:   Do not Eat or Drink after midnight except carb drink at 7:00 am        No Smoking the morning of surgery  :  Take these medicines the morning of surgery with A SIP OF WATER: Amlodipine, Prozac, hydrocodone, levothyroxine and zofran if needed   Do not wear jewelry, make-up or nail polish.  Do not wear lotions, powders, or perfumes. You may wear deodorant.  Do not shave 48 hours prior to surgery. Men may shave face and neck.  Do not bring valuables to the hospital.  Contacts, dentures or bridgework may not be worn into surgery.  Leave suitcase in the car. After surgery it may be brought to your room.  For patients admitted to the hospital, checkout time is 11:00 AM the day of discharge.   Patients discharged the day of surgery will not be allowed to drive home.    Special Instructions: Shower using CHG night before surgery and shower the day of surgery use CHG.  Use special wash - you have one bottle of CHG for all showers.  You should use approximately 1/2 of the bottle for each shower.  How to Use Chlorhexidine for Bathing Chlorhexidine gluconate (CHG) is a germ-killing (antiseptic) solution that is used to clean the skin. It can get rid of the bacteria that normally live on the skin and can keep them away for about 24 hours. To clean your skin with CHG, you may be given:  A CHG solution to use in the shower or as part of a sponge bath.  A prepackaged cloth that contains CHG. Cleaning your skin with CHG may help lower the risk for infection:  While you are staying in the intensive care unit of the hospital.  If you have a vascular access, such as a central line, to provide short-term or long-term access to your veins.  If you have a catheter to drain urine from your bladder.  If you are on a ventilator. A  ventilator is a machine that helps you breathe by moving air in and out of your lungs.  After surgery. What are the risks? Risks of using CHG include:  A skin reaction.  Hearing loss, if CHG gets in your ears.  Eye injury, if CHG gets in your eyes and is not rinsed out.  The CHG product catching fire. Make sure that you avoid smoking and flames after applying CHG to your skin. Do not use CHG:  If you have a chlorhexidine allergy or have previously reacted to chlorhexidine.  On babies younger than 33 months of age. How to use CHG solution  Use CHG only as told by your health care provider, and follow the instructions on the label.  Use the full amount of CHG as directed. Usually, this is one bottle. During a shower Follow these steps when using CHG solution during a shower (unless your health care provider gives you different instructions): 1. Start the shower. 2. Use your normal soap and shampoo to wash your face and hair. 3. Turn off the shower or move out of the shower stream. 4. Pour the CHG onto a clean washcloth. Do not use any type of brush or rough-edged sponge. 5. Starting at your neck, lather your body down to your toes. Make sure you follow  these instructions: ? If you will be having surgery, pay special attention to the part of your body where you will be having surgery. Scrub this area for at least 1 minute. ? Do not use CHG on your head or face. If the solution gets into your ears or eyes, rinse them well with water. ? Avoid your genital area. ? Avoid any areas of skin that have broken skin, cuts, or scrapes. ? Scrub your back and under your arms. Make sure to wash skin folds. 6. Let the lather sit on your skin for 1-2 minutes or as long as told by your health care provider. 7. Thoroughly rinse your entire body in the shower. Make sure that all body creases and crevices are rinsed well. 8. Dry off with a clean towel. Do not put any substances on your body  afterward--such as powder, lotion, or perfume--unless you are told to do so by your health care provider. Only use lotions that are recommended by the manufacturer. 9. Put on clean clothes or pajamas. 10. If it is the night before your surgery, sleep in clean sheets.   During a sponge bath Follow these steps when using CHG solution during a sponge bath (unless your health care provider gives you different instructions): 1. Use your normal soap and shampoo to wash your face and hair. 2. Pour the CHG onto a clean washcloth. 3. Starting at your neck, lather your body down to your toes. Make sure you follow these instructions: ? If you will be having surgery, pay special attention to the part of your body where you will be having surgery. Scrub this area for at least 1 minute. ? Do not use CHG on your head or face. If the solution gets into your ears or eyes, rinse them well with water. ? Avoid your genital area. ? Avoid any areas of skin that have broken skin, cuts, or scrapes. ? Scrub your back and under your arms. Make sure to wash skin folds. 4. Let the lather sit on your skin for 1-2 minutes or as long as told by your health care provider. 5. Using a different clean, wet washcloth, thoroughly rinse your entire body. Make sure that all body creases and crevices are rinsed well. 6. Dry off with a clean towel. Do not put any substances on your body afterward--such as powder, lotion, or perfume--unless you are told to do so by your health care provider. Only use lotions that are recommended by the manufacturer. 7. Put on clean clothes or pajamas. 8. If it is the night before your surgery, sleep in clean sheets. How to use CHG prepackaged cloths  Only use CHG cloths as told by your health care provider, and follow the instructions on the label.  Use the CHG cloth on clean, dry skin.  Do not use the CHG cloth on your head or face unless your health care provider tells you to.  When washing with  the CHG cloth: ? Avoid your genital area. ? Avoid any areas of skin that have broken skin, cuts, or scrapes. Before surgery Follow these steps when using a CHG cloth to clean before surgery (unless your health care provider gives you different instructions): 1. Using the CHG cloth, vigorously scrub the part of your body where you will be having surgery. Scrub using a back-and-forth motion for 3 minutes. The area on your body should be completely wet with CHG when you are done scrubbing. 2. Do not rinse. Discard the cloth and  let the area air-dry. Do not put any substances on the area afterward, such as powder, lotion, or perfume. 3. Put on clean clothes or pajamas. 4. If it is the night before your surgery, sleep in clean sheets.   For general bathing Follow these steps when using CHG cloths for general bathing (unless your health care provider gives you different instructions). 1. Use a separate CHG cloth for each area of your body. Make sure you wash between any folds of skin and between your fingers and toes. Wash your body in the following order, switching to a new cloth after each step: ? The front of your neck, shoulders, and chest. ? Both of your arms, under your arms, and your hands. ? Your stomach and groin area, avoiding the genitals. ? Your right leg and foot. ? Your left leg and foot. ? The back of your neck, your back, and your buttocks. 2. Do not rinse. Discard the cloth and let the area air-dry. Do not put any substances on your body afterward--such as powder, lotion, or perfume--unless you are told to do so by your health care provider. Only use lotions that are recommended by the manufacturer. 3. Put on clean clothes or pajamas. Contact a health care provider if:  Your skin gets irritated after scrubbing.  You have questions about using your solution or cloth. Get help right away if:  Your eyes become very red or swollen.  Your eyes itch badly.  Your skin itches badly  and is red or swollen.  Your hearing changes.  You have trouble seeing.  You have swelling or tingling in your mouth or throat.  You have trouble breathing.  You swallow any chlorhexidine. Summary  Chlorhexidine gluconate (CHG) is a germ-killing (antiseptic) solution that is used to clean the skin. Cleaning your skin with CHG may help to lower your risk for infection.  You may be given CHG to use for bathing. It may be in a bottle or in a prepackaged cloth to use on your skin. Carefully follow your health care provider's instructions and the instructions on the product label.  Do not use CHG if you have a chlorhexidine allergy.  Contact your health care provider if your skin gets irritated after scrubbing. This information is not intended to replace advice given to you by your health care provider. Make sure you discuss any questions you have with your health care provider. Document Revised: 06/11/2019 Document Reviewed: 06/11/2019 Elsevier Patient Education  2021 [place].  Minimally Invasive Cholecystectomy, Care After This sheet gives you information about how to care for yourself after your procedure. Your health care provider may also give you more specific instructions. If you have problems or questions, contact your health care provider. What can I expect after the procedure? After the procedure, it is common to have:  Pain at your incision sites. You will be given medicines to control this pain.  Mild nausea or vomiting.  Bloating and possible shoulder pain from the gas that was used during the procedure. Follow these instructions at home: Medicines  Take over-the-counter and prescription medicines only as told by your health care provider.  If you were prescribed an antibiotic medicine, take or use it as told by your health care provider. Do not stop using the antibiotic even if you start to feel better.  Ask your health care provider if the medicine prescribed  to you: ? Requires you to avoid driving or using machinery. ? Can cause constipation. You may  need to take these actions to prevent or treat constipation:  Drink enough fluid to keep your urine pale yellow.  Take over-the-counter or prescription medicines.  Eat foods that are high in fiber, such as beans, whole grains, and fresh fruits and vegetables.  Limit foods that are high in fat and processed sugars, such as fried or sweet foods. Incision care  Follow instructions from your health care provider about how to take care of your incisions. Make sure you: ? Wash your hands with soap and water for at least 20 seconds before and after you change your bandage (dressing). If soap and water are not available, use hand sanitizer. ? Change your dressing as told by your health care provider. ? Leave stitches (sutures), skin glue, or adhesive strips in place. These skin closures may need to be in place for 2 weeks or longer. If adhesive strip edges start to loosen and curl up, you may trim the loose edges. Do not remove adhesive strips completely unless your health care provider tells you to do that.  Do not take baths, swim, or use a hot tub until your health care provider approves. Ask your health care provider if you may take showers. You may only be allowed to take sponge baths.  Check your incision area every day for signs of infection. Check for: ? More redness, swelling, or pain. ? Fluid or blood. ? Warmth. ? Pus or a bad smell.   Activity  Rest as told by your health care provider.  Avoid sitting for a long time without moving. Get up to take short walks every 1-2 hours. This is important to improve blood flow and breathing. Ask for help if you feel weak or unsteady.  Do not lift anything that is heavier than 10 lb (4.5 kg), or the limit that you are told, until your health care provider says that it is safe.  Do not play contact sports until your health care provider approves.  Do  not return to work or school until your health care provider approves.  Return to your normal activities as told by your health care provider. Ask your health care provider what activities are safe for you. General instructions  If you were given a sedative during the procedure, it can affect you for several hours. Do not drive or operate machinery until your health care provider says that it is safe.  Keep all follow-up visits as told by your health care provider. This is important. Contact a health care provider if:  You develop a rash.  You have more redness, swelling, or pain around your incisions.  You have fluid or blood coming from your incisions.  Your incisions feel warm to the touch.  You have pus or a bad smell coming from your incisions.  You have a fever.  One or more of your incisions breaks open. Get help right away if:  You have trouble breathing.  You have chest pain.  You have increasing pain in your shoulders.  You faint or feel dizzy when you stand.  You have severe pain in your abdomen.  You have nausea or vomiting that lasts for more than one day.  You have leg pain. Summary  After your procedure, it is common to have pain at the incision sites. You may also have nausea or bloating.  Follow your health care provider's instructions about medicine, activity restrictions, and caring for your incision areas. Do not do activities that require a lot of effort.  Contact a health care provider if you have a fever or other signs of infection, such as more redness, swelling, or pain around the incisions.  Get help right away if you have chest pain, increasing pain in the shoulders, or trouble breathing. This information is not intended to replace advice given to you by your health care provider. Make sure you discuss any questions you have with your health care provider. Document Revised: 09/23/2018 Document Reviewed: 09/23/2018 Elsevier Patient Education   2021 [place].  Bilateral Salpingo-Oophorectomy, Care After This sheet gives you information about how to care for yourself after your procedure. Your health care provider may also give you more specific instructions. If you have problems or questions, contact your health care provider. What can I expect after the procedure? After the procedure, it is common to have:  Abdominal pain.  Some occasional vaginal bleeding (spotting).  Tiredness.  Symptoms of menopause, such as hot flashes, night sweats, or mood swings. Follow these instructions at home: Incision care  Keep your incision area and your bandage (dressing) clean and dry.  Follow instructions from your health care provider about how to take care of your incision. Make sure you: ? Wash your hands with soap and water before you change your dressing. If soap and water are not available, use hand sanitizer. ? Change your dressing as told by your health care provider. ? Leave stitches (sutures), staples, skin glue, or adhesive strips in place. These skin closures may need to stay in place for 2 weeks or longer. If adhesive strip edges start to loosen and curl up, you may trim the loose edges. Do not remove adhesive strips completely unless your health care provider tells you to do that.  Check your incision area every day for signs of infection. Check for: ? Redness, swelling, or pain. ? Fluid or blood. ? Warmth. ? Pus or a bad smell.   Activity  Do not drive or use heavy machinery while taking prescription pain medicine.  Do not drive for 24 hours if you received a medicine to help you relax (sedative) during your procedure.  Take frequent, short walks throughout the day. Rest when you get tired. Ask your health care provider what activities are safe for you.  Avoid activity that requires great effort. Also, avoid heavy lifting. Do not lift anything that is heavier than 10 lbs. (4.5 kg), or the limit that your health  care provider tells you, until he or she says that it is safe to do so.  Do not douche, use tampons, or have sex until your health care provider approves.   General instructions  To prevent or treat constipation while you are taking prescription pain medicine, your health care provider may recommend that you: ? Drink enough fluid to keep your urine clear or pale yellow. ? Take over-the-counter or prescription medicines. ? Eat foods that are high in fiber, such as fresh fruits and vegetables, whole grains, and beans. ? Limit foods that are high in fat and processed sugars, such as fried and sweet foods.  Take over-the-counter and prescription medicines only as told by your health care provider.  Do not take baths, swim, or use a hot tub until your health care provider approves. Ask your health care provider if you can take showers. You may only be allowed to take sponge baths for bathing.  Wear compression stockings as told by your health care provider. These stockings help to prevent blood clots and reduce swelling in your  legs.  Keep all follow-up visits as told by your health care provider. This is important.   Contact a health care provider if:  You have pain when you urinate.  You have pus or a bad smelling discharge coming from your vagina.  You have redness, swelling, or pain around your incision.  You have fluid or blood coming from your incision.  Your incision feels warm to the touch.  You have pus or a bad smell coming from your incision.  You have a fever.  Your incision starts to break open.  You have pain in the abdomen, and it gets worse or does not get better when you take medicine.  You develop a rash.  You develop nausea and vomiting.  You feel lightheaded. Get help right away if:  You develop pain in your chest or leg.  You become short of breath.  You faint.  You have increased bleeding from your vagina. Summary  After the procedure, it is  common to have pain, bleeding in the vagina, and symptoms of menopause.  Follow instructions from your health care provider about how to take care of your incision.  Follow instructions from your health care provider about activities and restrictions.  Check your incision every day for signs of infection and report any symptoms to your health care provider. This information is not intended to replace advice given to you by your health care provider. Make sure you discuss any questions you have with your health care provider. Document Revised: 02/27/2018 Document Reviewed: 01/29/2016 Elsevier Patient Education  2021 [place] Anesthesia, Adult, Care After This sheet gives you information about how to care for yourself after your procedure. Your health care provider may also give you more specific instructions. If you have problems or questions, contact your health care provider. What can I expect after the procedure? After the procedure, the following side effects are common:  Pain or discomfort at the IV site.  Nausea.  Vomiting.  Sore throat.  Trouble concentrating.  Feeling cold or chills.  Feeling weak or tired.  Sleepiness and fatigue.  Soreness and body aches. These side effects can affect parts of the body that were not involved in surgery. Follow these instructions at home: For the time period you were told by your health care provider:  Rest.  Do not participate in activities where you could fall or become injured.  Do not drive or use machinery.  Do not drink alcohol.  Do not take sleeping pills or medicines that cause drowsiness.  Do not make important decisions or sign legal documents.  Do not take care of children on your own.   Eating and drinking  Follow any instructions from your health care provider about eating or drinking restrictions.  When you feel hungry, start by eating small amounts of foods that are soft and easy to digest  (bland), such as toast. Gradually return to your regular diet.  Drink enough fluid to keep your urine pale yellow.  If you vomit, rehydrate by drinking water, juice, or clear broth. General instructions  If you have sleep apnea, surgery and certain medicines can increase your risk for breathing problems. Follow instructions from your health care provider about wearing your sleep device: ? Anytime you are sleeping, including during daytime naps. ? While taking prescription pain medicines, sleeping medicines, or medicines that make you drowsy.  Have a responsible adult stay with you for the time you are told. It is important to have  someone help care for you until you are awake and alert.  Return to your normal activities as told by your health care provider. Ask your health care provider what activities are safe for you.  Take over-the-counter and prescription medicines only as told by your health care provider.  If you smoke, do not smoke without supervision.  Keep all follow-up visits as told by your health care provider. This is important. Contact a health care provider if:  You have nausea or vomiting that does not get better with medicine.  You cannot eat or drink without vomiting.  You have pain that does not get better with medicine.  You are unable to pass urine.  You develop a skin rash.  You have a fever.  You have redness around your IV site that gets worse. Get help right away if:  You have difficulty breathing.  You have chest pain.  You have blood in your urine or stool, or you vomit blood. Summary  After the procedure, it is common to have a sore throat or nausea. It is also common to feel tired.  Have a responsible adult stay with you for the time you are told. It is important to have someone help care for you until you are awake and alert.  When you feel hungry, start by eating small amounts of foods that are soft and easy to digest (bland), such as  toast. Gradually return to your regular diet.  Drink enough fluid to keep your urine pale yellow.  Return to your normal activities as told by your health care provider. Ask your health care provider what activities are safe for you. This information is not intended to replace advice given to you by your health care provider. Make sure you discuss any questions you have with your health care provider. Document Revised: 09/09/2019 Document Reviewed: 04/08/2019 Elsevier Patient Education  2021 [employer].

## 2020-01-25 ENCOUNTER — Encounter (HOSPITAL_COMMUNITY): Payer: Self-pay

## 2020-01-25 ENCOUNTER — Encounter (HOSPITAL_COMMUNITY)
Admission: RE | Admit: 2020-01-25 | Discharge: 2020-01-25 | Disposition: A | Discharge status: Home / Self Care | Payer: Medicare HMO | Source: Ambulatory Visit | Attending: Obstetrics & Gynecology | Admitting: Obstetrics & Gynecology

## 2020-01-25 ENCOUNTER — Other Ambulatory Visit: Payer: Self-pay

## 2020-01-25 ENCOUNTER — Other Ambulatory Visit (HOSPITAL_COMMUNITY)
Admission: RE | Admit: 2020-01-25 | Discharge: 2020-01-25 | Disposition: A | Discharge status: Home / Self Care | Payer: Medicare HMO | Source: Ambulatory Visit | Attending: Obstetrics & Gynecology | Admitting: Obstetrics & Gynecology

## 2020-01-25 DIAGNOSIS — Z01812 Encounter for preprocedural laboratory examination: Secondary | ICD-10-CM | POA: Insufficient documentation

## 2020-01-25 DIAGNOSIS — Z20822 Contact with and (suspected) exposure to covid-19: Secondary | ICD-10-CM | POA: Insufficient documentation

## 2020-01-25 LAB — URINALYSIS, ROUTINE W REFLEX MICROSCOPIC
Bilirubin Urine: NEGATIVE
Glucose, UA: NEGATIVE mg/dL
Hgb urine dipstick: NEGATIVE
Ketones, ur: NEGATIVE mg/dL
Leukocytes,Ua: NEGATIVE
Nitrite: POSITIVE — AB
Protein, ur: NEGATIVE mg/dL
Specific Gravity, Urine: 1.02 (ref 1.005–1.030)
pH: 5 (ref 5.0–8.0)

## 2020-01-25 LAB — COMPREHENSIVE METABOLIC PANEL
ALT: 26 U/L (ref 0–44)
AST: 23 U/L (ref 15–41)
Albumin: 3.5 g/dL (ref 3.5–5.0)
Alkaline Phosphatase: 84 U/L (ref 38–126)
Anion gap: 11 (ref 5–15)
BUN: 17 mg/dL (ref 8–23)
CO2: 27 mmol/L (ref 22–32)
Calcium: 9.4 mg/dL (ref 8.9–10.3)
Chloride: 97 mmol/L — ABNORMAL LOW (ref 98–111)
Creatinine, Ser: 0.79 mg/dL (ref 0.44–1.00)
GFR, Estimated: >60 mL/min (ref >60)
Glucose, Bld: 191 mg/dL — ABNORMAL HIGH (ref 70–99)
Potassium: 3.7 mmol/L (ref 3.5–5.1)
Sodium: 135 mmol/L (ref 135–145)
Total Bilirubin: 0.4 mg/dL (ref 0.3–1.2)
Total Protein: 8.5 g/dL — ABNORMAL HIGH (ref 6.5–8.1)

## 2020-01-25 LAB — TYPE AND SCREEN
ABO/RH(D): B POS
Antibody Screen: NEGATIVE

## 2020-01-25 LAB — CBC
HCT: 43.3 % (ref 36.0–46.0)
Hemoglobin: 13.9 g/dL (ref 12.0–15.0)
MCH: 28.7 pg (ref 26.0–34.0)
MCHC: 32.1 g/dL (ref 30.0–36.0)
MCV: 89.5 fL (ref 80.0–100.0)
Platelets: 279 10*3/uL (ref 150–400)
RBC: 4.84 MIL/uL (ref 3.87–5.11)
RDW: 13.1 % (ref 11.5–15.5)
WBC: 7.4 10*3/uL (ref 4.0–10.5)
nRBC: 0 % (ref 0.0–0.2)

## 2020-01-25 LAB — SARS CORONAVIRUS 2 (TAT 6-24 HRS): SARS Coronavirus 2: NEGATIVE

## 2020-01-25 LAB — RAPID HIV SCREEN (HIV 1/2 AB+AG)
HIV 1/2 Antibodies: NONREACTIVE
HIV-1 P24 Antigen - HIV24: NONREACTIVE

## 2020-01-26 ENCOUNTER — Encounter (HOSPITAL_COMMUNITY)
Admission: RE | Disposition: A | Discharge status: Home / Self Care | Payer: Self-pay | Source: Home / Self Care | Attending: Obstetrics & Gynecology

## 2020-01-26 ENCOUNTER — Encounter (HOSPITAL_COMMUNITY): Payer: Self-pay | Admitting: Obstetrics & Gynecology

## 2020-01-26 ENCOUNTER — Ambulatory Visit (HOSPITAL_COMMUNITY)
Admission: RE | Admit: 2020-01-26 | Discharge: 2020-01-26 | Disposition: A | Discharge status: Home / Self Care | Payer: Medicare HMO | Attending: Obstetrics & Gynecology | Admitting: Obstetrics & Gynecology

## 2020-01-26 ENCOUNTER — Ambulatory Visit (HOSPITAL_COMMUNITY): Payer: Medicare HMO | Admitting: Anesthesiology

## 2020-01-26 DIAGNOSIS — K8010 Calculus of gallbladder with chronic cholecystitis without obstruction: Secondary | ICD-10-CM | POA: Diagnosis not present

## 2020-01-26 DIAGNOSIS — Z7984 Long term (current) use of oral hypoglycemic drugs: Secondary | ICD-10-CM | POA: Diagnosis not present

## 2020-01-26 DIAGNOSIS — N83202 Unspecified ovarian cyst, left side: Secondary | ICD-10-CM

## 2020-01-26 DIAGNOSIS — Z79899 Other long term (current) drug therapy: Secondary | ICD-10-CM | POA: Insufficient documentation

## 2020-01-26 DIAGNOSIS — Z800 Family history of malignant neoplasm of digestive organs: Secondary | ICD-10-CM | POA: Insufficient documentation

## 2020-01-26 DIAGNOSIS — Z888 Allergy status to other drugs, medicaments and biological substances status: Secondary | ICD-10-CM | POA: Insufficient documentation

## 2020-01-26 DIAGNOSIS — N839 Noninflammatory disorder of ovary, fallopian tube and broad ligament, unspecified: Secondary | ICD-10-CM | POA: Diagnosis not present

## 2020-01-26 DIAGNOSIS — K8020 Calculus of gallbladder without cholecystitis without obstruction: Secondary | ICD-10-CM

## 2020-01-26 DIAGNOSIS — Z803 Family history of malignant neoplasm of breast: Secondary | ICD-10-CM | POA: Insufficient documentation

## 2020-01-26 DIAGNOSIS — Z79890 Hormone replacement therapy (postmenopausal): Secondary | ICD-10-CM | POA: Insufficient documentation

## 2020-01-26 HISTORY — PX: CHOLECYSTECTOMY: SHX55

## 2020-01-26 HISTORY — PX: LAPAROSCOPIC BILATERAL SALPINGO OOPHERECTOMY: SHX5890

## 2020-01-26 LAB — ABO/RH: ABO/RH(D): B POS

## 2020-01-26 SURGERY — SALPINGO-OOPHORECTOMY, BILATERAL, LAPAROSCOPIC
Anesthesia: General

## 2020-01-26 MED ORDER — DEXTROSE 5 % IV SOLN: 3.0000 g | INTRAVENOUS | Status: DC

## 2020-01-26 MED ORDER — HEMOSTATIC AGENTS (NO CHARGE) OPTIME
TOPICAL | Status: DC | PRN
  Administered 2020-01-26: 1 via TOPICAL

## 2020-01-26 MED ORDER — LACTATED RINGERS IV SOLN
INTRAVENOUS | Status: DC
  Administered 2020-01-26: 1000 mL via INTRAVENOUS

## 2020-01-26 MED ORDER — PROPOFOL 10 MG/ML IV BOLUS
INTRAVENOUS | Status: AC
  Filled 2020-01-26: qty 40

## 2020-01-26 MED ORDER — CHLORHEXIDINE GLUCONATE CLOTH 2 % EX PADS: 6.0000 | MEDICATED_PAD | Freq: Once | CUTANEOUS | Status: DC

## 2020-01-26 MED ORDER — SUGAMMADEX SODIUM 500 MG/5ML IV SOLN
INTRAVENOUS | Status: DC | PRN
  Administered 2020-01-26: 250 mg via INTRAVENOUS

## 2020-01-26 MED ORDER — DEXAMETHASONE SODIUM PHOSPHATE 10 MG/ML IJ SOLN
INTRAMUSCULAR | Status: AC
  Filled 2020-01-26: qty 1

## 2020-01-26 MED ORDER — POVIDONE-IODINE 10 % EX SWAB: 2.0000 "application " | Freq: Once | CUTANEOUS | Status: DC

## 2020-01-26 MED ORDER — SODIUM CHLORIDE (PF) 0.9 % IJ SOLN
INTRAMUSCULAR | Status: AC
  Filled 2020-01-26: qty 20

## 2020-01-26 MED ORDER — LIDOCAINE HCL (PF) 2 % IJ SOLN
INTRAMUSCULAR | Status: AC
  Filled 2020-01-26: qty 5

## 2020-01-26 MED ORDER — LIDOCAINE HCL (CARDIAC) PF 50 MG/5ML IV SOSY
PREFILLED_SYRINGE | INTRAVENOUS | Status: DC | PRN
  Administered 2020-01-26: 60 mg via INTRAVENOUS

## 2020-01-26 MED ORDER — DEXAMETHASONE SODIUM PHOSPHATE 10 MG/ML IJ SOLN
INTRAMUSCULAR | Status: DC | PRN
  Administered 2020-01-26: 10 mg via INTRAVENOUS

## 2020-01-26 MED ORDER — SODIUM CHLORIDE 0.9 % IR SOLN
Status: DC | PRN
  Administered 2020-01-26: 1000 mL
  Administered 2020-01-26: 3000 mL

## 2020-01-26 MED ORDER — SUCCINYLCHOLINE CHLORIDE 20 MG/ML IJ SOLN
INTRAMUSCULAR | Status: DC | PRN
  Administered 2020-01-26: 120 mg via INTRAVENOUS

## 2020-01-26 MED ORDER — ROCURONIUM BROMIDE 10 MG/ML (PF) SYRINGE
PREFILLED_SYRINGE | INTRAVENOUS | Status: AC
  Filled 2020-01-26: qty 10

## 2020-01-26 MED ORDER — CEFAZOLIN SODIUM-DEXTROSE 2-4 GM/100ML-% IV SOLN
2.0000 g | INTRAVENOUS | Status: AC
  Administered 2020-01-26: 1 g via INTRAVENOUS
  Administered 2020-01-26: 2 g via INTRAVENOUS

## 2020-01-26 MED ORDER — ONDANSETRON 8 MG PO TBDP: 8.0000 mg | ORAL_TABLET | Freq: Three times a day (TID) | ORAL | 0 refills | Status: DC | PRN

## 2020-01-26 MED ORDER — CEFAZOLIN SODIUM-DEXTROSE 2-4 GM/100ML-% IV SOLN
INTRAVENOUS | Status: AC
  Filled 2020-01-26: qty 100

## 2020-01-26 MED ORDER — BUPIVACAINE LIPOSOME 1.3 % IJ SUSP
INTRAMUSCULAR | Status: AC
  Filled 2020-01-26: qty 20

## 2020-01-26 MED ORDER — PROPOFOL 10 MG/ML IV BOLUS
INTRAVENOUS | Status: DC | PRN
  Administered 2020-01-26: 150 mg via INTRAVENOUS
  Administered 2020-01-26: 50 mg via INTRAVENOUS

## 2020-01-26 MED ORDER — HYDROMORPHONE HCL 1 MG/ML IJ SOLN
0.2500 mg | INTRAMUSCULAR | Status: DC | PRN
  Administered 2020-01-26 (×2): 0.5 mg via INTRAVENOUS
  Filled 2020-01-26 (×2): qty 0.5

## 2020-01-26 MED ORDER — ORAL CARE MOUTH RINSE: 15.0000 mL | Freq: Once | OROMUCOSAL | Status: AC

## 2020-01-26 MED ORDER — BUPIVACAINE LIPOSOME 1.3 % IJ SUSP
20.0000 mL | Freq: Once | INTRAMUSCULAR | Status: DC
  Filled 2020-01-26: qty 20

## 2020-01-26 MED ORDER — OXYCODONE-ACETAMINOPHEN 5-325 MG PO TABS: 1.0000 | ORAL_TABLET | ORAL | 0 refills | Status: DC | PRN

## 2020-01-26 MED ORDER — ONDANSETRON HCL 4 MG/2ML IJ SOLN
INTRAMUSCULAR | Status: DC | PRN
  Administered 2020-01-26: 4 mg via INTRAVENOUS

## 2020-01-26 MED ORDER — CEFAZOLIN SODIUM-DEXTROSE 1-4 GM/50ML-% IV SOLN
INTRAVENOUS | Status: AC
  Filled 2020-01-26: qty 50

## 2020-01-26 MED ORDER — FENTANYL CITRATE (PF) 250 MCG/5ML IJ SOLN
INTRAMUSCULAR | Status: AC
  Filled 2020-01-26: qty 5

## 2020-01-26 MED ORDER — SODIUM CHLORIDE 0.9 % IV SOLN
INTRAVENOUS | Status: DC | PRN
  Administered 2020-01-26: 40 mL

## 2020-01-26 MED ORDER — CHLORHEXIDINE GLUCONATE 0.12 % MT SOLN
15.0000 mL | Freq: Once | OROMUCOSAL | Status: AC
  Administered 2020-01-26: 15 mL via OROMUCOSAL

## 2020-01-26 MED ORDER — SEVOFLURANE IN SOLN
RESPIRATORY_TRACT | Status: AC
  Filled 2020-01-26: qty 250

## 2020-01-26 MED ORDER — ONDANSETRON HCL 4 MG/2ML IJ SOLN
INTRAMUSCULAR | Status: AC
  Filled 2020-01-26: qty 2

## 2020-01-26 MED ORDER — ONDANSETRON HCL 4 MG/2ML IJ SOLN: 4.0000 mg | Freq: Once | INTRAMUSCULAR | Status: DC | PRN

## 2020-01-26 MED ORDER — ROCURONIUM BROMIDE 100 MG/10ML IV SOLN
INTRAVENOUS | Status: DC | PRN
  Administered 2020-01-26 (×2): 20 mg via INTRAVENOUS
  Administered 2020-01-26: 10 mg via INTRAVENOUS
  Administered 2020-01-26: 50 mg via INTRAVENOUS

## 2020-01-26 MED ORDER — SUCCINYLCHOLINE CHLORIDE 200 MG/10ML IV SOSY
PREFILLED_SYRINGE | INTRAVENOUS | Status: AC
  Filled 2020-01-26: qty 10

## 2020-01-26 MED ORDER — MEPERIDINE HCL 50 MG/ML IJ SOLN: 6.2500 mg | INTRAMUSCULAR | Status: DC | PRN

## 2020-01-26 MED ORDER — KETOROLAC TROMETHAMINE 30 MG/ML IJ SOLN
30.0000 mg | Freq: Once | INTRAMUSCULAR | Status: AC
  Administered 2020-01-26: 30 mg via INTRAVENOUS

## 2020-01-26 MED ORDER — FENTANYL CITRATE (PF) 100 MCG/2ML IJ SOLN
INTRAMUSCULAR | Status: DC | PRN
  Administered 2020-01-26 (×5): 50 ug via INTRAVENOUS
  Administered 2020-01-26: 100 ug via INTRAVENOUS
  Administered 2020-01-26: 50 ug via INTRAVENOUS

## 2020-01-26 MED ORDER — KETOROLAC TROMETHAMINE 30 MG/ML IJ SOLN
INTRAMUSCULAR | Status: AC
  Filled 2020-01-26: qty 1

## 2020-01-26 MED ORDER — KETOROLAC TROMETHAMINE 10 MG PO TABS: 10.0000 mg | ORAL_TABLET | Freq: Three times a day (TID) | ORAL | 0 refills | Status: DC | PRN

## 2020-01-26 SURGICAL SUPPLY — 66 items
ADH SKN CLS APL DERMABOND .7 (GAUZE/BANDAGES/DRESSINGS) ×6
APPLIER CLIP ROT 10 11.4 M/L (STAPLE) ×4
APR CLP MED LRG 11.4X10 (STAPLE) ×3
BAG DRN RND TRDRP ANRFLXCHMBR (UROLOGICAL SUPPLIES) ×3
BAG RETRIEVAL 10 (BASKET) ×2
BAG SPEC RTRVL LRG 6X4 10 (ENDOMECHANICALS) ×3
BAG URINE DRAIN 2000ML AR STRL (UROLOGICAL SUPPLIES) ×2 IMPLANT
BLADE SURG 15 STRL LF DISP TIS (BLADE) ×3 IMPLANT
BLADE SURG 15 STRL SS (BLADE) ×4
BLADE SURG SZ11 CARB STEEL (BLADE) ×4 IMPLANT
CATH FOLEY 2WAY SLVR  5CC 14FR (CATHETERS) ×4
CATH FOLEY 2WAY SLVR 5CC 14FR (CATHETERS) ×1 IMPLANT
CLIP APPLIE ROT 10 11.4 M/L (STAPLE) ×3 IMPLANT
CLOTH BEACON ORANGE TIMEOUT ST (SAFETY) ×8 IMPLANT
COVER LIGHT HANDLE STERIS (MISCELLANEOUS) ×16 IMPLANT
COVER WAND RF STERILE (DRAPES) ×8 IMPLANT
DECANTER SPIKE VIAL GLASS SM (MISCELLANEOUS) ×4 IMPLANT
DERMABOND ADVANCED (GAUZE/BANDAGES/DRESSINGS) ×2
DERMABOND ADVANCED .7 DNX12 (GAUZE/BANDAGES/DRESSINGS) ×4 IMPLANT
ELECT REM PT RETURN 9FT ADLT (ELECTROSURGICAL) ×8
ELECTRODE REM PT RTRN 9FT ADLT (ELECTROSURGICAL) ×6 IMPLANT
GLOVE BIO SURGEON STRL SZ 6.5 (GLOVE) ×4 IMPLANT
GLOVE BIOGEL PI IND STRL 6.5 (GLOVE) ×3 IMPLANT
GLOVE BIOGEL PI IND STRL 7.0 (GLOVE) ×12 IMPLANT
GLOVE BIOGEL PI IND STRL 8 (GLOVE) ×4 IMPLANT
GLOVE BIOGEL PI INDICATOR 6.5 (GLOVE) ×1
GLOVE BIOGEL PI INDICATOR 7.0 (GLOVE) ×4
GLOVE BIOGEL PI INDICATOR 8 (GLOVE) ×2
GLOVE ECLIPSE 8.0 STRL XLNG CF (GLOVE) ×6 IMPLANT
GOWN STRL REUS W/TWL LRG LVL3 (GOWN DISPOSABLE) ×16 IMPLANT
GOWN STRL REUS W/TWL XL LVL3 (GOWN DISPOSABLE) ×4 IMPLANT
HEMOSTAT SNOW SURGICEL 2X4 (HEMOSTASIS) ×4 IMPLANT
INST SET LAPROSCOPIC AP (KITS) ×4 IMPLANT
IV NS IRRIG 3000ML ARTHROMATIC (IV SOLUTION) ×2 IMPLANT
KIT TURNOVER CYSTO (KITS) ×4 IMPLANT
KIT TURNOVER KIT A (KITS) ×4 IMPLANT
MANIFOLD NEPTUNE II (INSTRUMENTS) ×8 IMPLANT
NDL HYPO 21X1.5 SAFETY (NEEDLE) ×2 IMPLANT
NDL INSUFFLATION 14GA 120MM (NEEDLE) ×4 IMPLANT
NEEDLE HYPO 21X1.5 SAFETY (NEEDLE) ×4 IMPLANT
NEEDLE INSUFFLATION 14GA 120MM (NEEDLE) ×8 IMPLANT
NS IRRIG 1000ML POUR BTL (IV SOLUTION) ×4 IMPLANT
PACK PERI GYN (CUSTOM PROCEDURE TRAY) ×4 IMPLANT
PAD ARMBOARD 7.5X6 YLW CONV (MISCELLANEOUS) ×8 IMPLANT
POUCH SPECIMEN RETRIEVAL 10MM (ENDOMECHANICALS) ×4 IMPLANT
SET BASIN LINEN APH (SET/KITS/TRAYS/PACK) ×8 IMPLANT
SET TUBE IRRIG SUCTION NO TIP (IRRIGATION / IRRIGATOR) ×2 IMPLANT
SET TUBE SMOKE EVAC HIGH FLOW (TUBING) ×4 IMPLANT
SHEARS HARMONIC ACE PLUS 36CM (ENDOMECHANICALS) ×4 IMPLANT
SLEEVE ENDOPATH XCEL 5M (ENDOMECHANICALS) ×8 IMPLANT
SOL ANTI FOG 6CC (MISCELLANEOUS) ×1 IMPLANT
SOLUTION ANTI FOG 6CC (MISCELLANEOUS) ×5 IMPLANT
SPONGE GAUZE 2X2 8PLY STRL LF (GAUZE/BANDAGES/DRESSINGS) ×12 IMPLANT
SUT MNCRL AB 4-0 PS2 18 (SUTURE) ×8 IMPLANT
SUT VICRYL 0 UR6 27IN ABS (SUTURE) ×8 IMPLANT
SUT VICRYL AB 3-0 FS1 BRD 27IN (SUTURE) ×6 IMPLANT
SYR 10ML LL (SYRINGE) ×4 IMPLANT
SYR 20ML LL LF (SYRINGE) ×4 IMPLANT
SYS BAG RETRIEVAL 10MM (BASKET) ×6
SYSTEM BAG RETRIEVAL 10MM (BASKET) ×4 IMPLANT
TROCAR ENDO BLADELESS 11MM (ENDOMECHANICALS) ×8 IMPLANT
TROCAR XCEL NON-BLD 5MMX100MML (ENDOMECHANICALS) ×8 IMPLANT
TROCAR XCEL UNIV SLVE 11M 100M (ENDOMECHANICALS) ×4 IMPLANT
TUBE CONNECTING 12X1/4 (SUCTIONS) ×4 IMPLANT
TUBING EVAC SMOKE HEATED PNEUM (TUBING) ×4 IMPLANT
WARMER LAPAROSCOPE (MISCELLANEOUS) ×4 IMPLANT

## 2020-01-26 NOTE — Anesthesia Postprocedure Evaluation (Signed)
Anesthesia Post Note  Patient: [name]  Procedure(s) Performed: LAPAROSCOPIC BILATERAL SALPINGO OOPHORECTOMY LAPAROSCOPIC CHOLECYSTECTOMY (N/A )  Patient location during evaluation: PACU Anesthesia Type: General Level of consciousness: awake, oriented, awake and alert and patient cooperative Pain management: satisfactory to patient Vital Signs Assessment: post-procedure vital signs reviewed and stable Respiratory status: spontaneous breathing, respiratory function stable, nonlabored ventilation and patient connected to face mask oxygen Cardiovascular status: stable Postop Assessment: no apparent nausea or vomiting Anesthetic complications: no   No complications documented.   Last Vitals:  Vitals:   01/26/20 0923  BP: (!) 148/82  Resp: 20  Temp: 36.9 C  SpO2: 99%    Last Pain:  Vitals:   01/26/20 0923  TempSrc: Oral  PainSc: 0-No pain                 Lilu Mcglown

## 2020-01-26 NOTE — Interval H&P Note (Signed)
History and Physical Interval Note:  01/26/2020 9:04 AM  Bethany Myers  has presented today for surgery, with the diagnosis of Pelvic Mass in post Menopausal Woman, cholelithiasis.  The various methods of treatment have been discussed with the patient and family. After consideration of risks, benefits and other options for treatment, the patient has consented to  Procedure(s) with comments: LAPAROSCOPIC OOPHORECTOMY (Bilateral) - pt knows to arrive at 9:00 [place] (N/A) as a surgical intervention.  The patient's history has been reviewed, patient examined, no change in status, stable for surgery.  I have reviewed the patient's chart and labs.  Questions were answered to the patient's satisfaction.     No questions regarding laparoscopic cholecystectomy. Dr. Elonda Husky discussing remainder with patient.  Virl Cagey

## 2020-01-26 NOTE — Anesthesia Procedure Notes (Signed)
Procedure Name: Intubation Date/Time: 01/26/2020 10:51 AM Performed by: Vista Deck, CRNA Pre-anesthesia Checklist: Patient identified, Patient being monitored, Timeout performed, Emergency Drugs available and Suction available Patient Re-evaluated:Patient Re-evaluated prior to induction Oxygen Delivery Method: Circle System Utilized Preoxygenation: Pre-oxygenation with 100% oxygen Induction Type: IV induction Laryngoscope Size: Mac and 3 Grade View: Grade II Tube type: Oral Tube size: 7.0 mm Number of attempts: 1 Airway Equipment and Method: stylet Placement Confirmation: ETT inserted through vocal cords under direct vision,  positive ETCO2 and breath sounds checked- equal and bilateral Secured at: 21 cm Tube secured with: Tape Dental Injury: Teeth and Oropharynx as per pre-operative assessment

## 2020-01-26 NOTE — Transfer of Care (Signed)
Immediate Anesthesia Transfer of Care Note  Patient: Bethany Myers  Procedure(s) Performed: LAPAROSCOPIC BILATERAL SALPINGO OOPHORECTOMY LAPAROSCOPIC CHOLECYSTECTOMY (N/A )  Patient Location: PACU  Anesthesia Type:General  Level of Consciousness: awake, alert , oriented and patient cooperative  Airway & Oxygen Therapy: Patient Spontanous Breathing and Patient connected to face mask oxygen  Post-op Assessment: Report given to RN, Post -op Vital signs reviewed and stable and Patient moving all extremities X 4  Post vital signs: Reviewed and stable  Last Vitals:  Vitals Value Taken Time  BP    Temp    Pulse 88 01/26/20 1352  Resp 12 01/26/20 1352  SpO2 97 % 01/26/20 1352  Vitals shown include unvalidated device data.  Last Pain:  Vitals:   01/26/20 0923  TempSrc: Oral  PainSc: 0-No pain      Patients Stated Pain Goal: 8 (10/62/69 4854)  Complications: No complications documented.

## 2020-01-26 NOTE — Op Note (Signed)
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  Took a bit of effort to liberate it from the peritoneal adhesions which were encapsulating it.  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  The left ovary was also adherent to the left pelvic sidewall and had a small simple cyst but appeared benign  Description of operation: Patient was taken to the operating room and placed in the supine position where she underwent general tracheal anesthesia She was prepped and draped in usual sterile fashion after being placed in low lithotomy position for laparoscopic surgery  Of note Dr. Blake Myers was also doing laparoscopic surgery, a laparoscopic cholecystectomy, and I will allow her operative report to address that surgery  An incision was made in the umbilicus The rectus fascia was grasped with a Coker clamp A VER ES needle was placed into the peritoneal cavity of 1 pass slight difficulty The peritoneal cavity was insufflated An 11 mm nonbladed video laparoscope trocar was placed into the peritoneal cavity 1 Pap slight difficulty The peritoneal cavity was confirmed 5 mm nonbleeding trochars were placed in the right and left lower quadrant under direct visualization without difficulty The right ovary was identified and found to be adherent to the right pelvic sidewall the pelvic floor and the top of the vaginal cuff Blunt dissection was performed as well as dissection using the harmonic scalpel The uterine vasculature were coagulated and transected using the harmonic  scalpel with good hemostasis The right fallopian tube was removed separately and sent separately The right ovary was then dissected free from its encased peritoneal adhesions and left in the pelvis  Attention was then turned to the left ovary and fallopian tube It was adherent to the left pelvic sidewall The harmonic scalpel was used to transect coagulate and transect the left ovarian vasculature with good hemostasis The left ovary was grasped pulled away from the pelvic sidewall and dissected off the sidewall using the harmonic scalpel with good hemostasis Both pedicles were hemostatic and then I turned the case over to Dr. Constance Myers who did her laparoscopic cholecystectomy  The reason I left the ovaries in place was because the right ovary was consistent with a 7 cm found on the scan and I was concerned if I got that ovary out prior to the cholecystectomy I would undermine Dr. Constance Myers hemoperitoneum which could undermine her ability to do the laparoscopic cholecystectomy  After Dr. 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 There was no intraperitoneal spill The left ovary was taken out to the 1011  trocar with an alligator clamp  Again all pedicles were found to be hemostatic The umbilical fascia was closed using 0 Vicryl running The umbilical incision was closed at the subcutaneous level as well  All the incisions were closed using 3-0 Vicryl in a subcuticular fashion Dermabond was placed over all the incisions 266 mg of Exparel was injected at all the incision sites for postoperative pain management  The patient  received 3 g of Ancef and 30 mg of Toradol preoperatively The patient tolerated the procedure well She experienced minimal blood loss less than 25 cc total  She will be discharged from the PACU and follow-up in my office next week  Bethany Buff, MD 01/26/2020 1:40 PM

## 2020-01-26 NOTE — Op Note (Signed)
Operative Note   Preoperative Diagnosis: Symptomatic cholelithiasis   Postoperative Diagnosis: Same   Procedure(s) Performed: Laparoscopic cholecystectomy   Surgeon: Ria Comment C. Constance Haw, MD   Assistants: No qualified resident was available   Anesthesia: General endotracheal   Anesthesiologist: Denese Killings, MD    Specimens: Gallbladder    Estimated Blood Loss: Minimal    Blood Replacement: None    Complications: None    Operative Findings: Distended gallbladder with stone    Procedure:  See Dr. Brynda Greathouse procedure note for initiation of her operating room time. Once Dr. Elonda Husky had finished his dissection he determined he would compromise pneumoperitoneum if he were to remove the right ovary before my procedure, so he called me in.  He already had pneumoperitoneum established and an umbilical trocar.  A JACHO approved time out for my part of the procedure was completed.  My remaining trocars were placed under direct vision. Two 5 mm ports were placed in the right abdomen, between the anterior axillary and midclavicular line.  A final 11 mm port was placed through the mid-epigastrium, near the falciform ligament.    The gallbladder fundus was elevated cephalad and the infundibulum was retracted to the patient's right. We placed the patient in as much reverse Trendelenburg as possible given the lithotomy position.  The gallbladder/cystic duct junction was skeletonized. The cystic artery noted in the triangle of Calot and was also skeletonized.  We then continued liberal medial and lateral dissection until the critical view of safety was achieved.    The cystic duct and cystic artery were triply clipped and divided. The gallbladder was then dissected from the liver bed with electrocautery. The specimen was placed in an Endopouch and was retrieved through the epigastric site.   Final inspection revealed acceptable hemostasis of the bed. Surgical SNOW was placed in the gallbladder bed.   The epigastric port was removed and the fascia was closed with a 0 Vicryl fascial suture. Dr. Elonda Husky then came back into the room to complete his portion of the procedure. See Dr. Brynda Greathouse procedure note for the remaining documentation and skin closure.    Curlene Labrum, MD [facility] [address] [place],  [id number] [phone] (office)

## 2020-01-26 NOTE — H&P (Signed)
Preoperative History and Physical  Bethany Myers is a 70 y.o. G1P0 with No LMP recorded. Patient has had a hysterectomy. admitted for a laparoscopic removal of both tubes and ovaries and pelvic masses .  Normal CA 125 and appearances most consistent with benign masses pt of course understands could be malignant potentially, low liklihood  PMH:        Past Medical History:  Diagnosis Date  . Acid reflux disease   . Depression   . Diabetes mellitus without complication ([place])   . Goiter   . Hypertension     PSH:          Past Surgical History:  Procedure Laterality Date  . ABDOMINAL HYSTERECTOMY    . APPENDECTOMY    . BACK SURGERY    . cataracts    . TUBAL LIGATION      POb/GynH:              OB History    Gravida  1   Para      Term      Preterm      AB      Living  0     SAB      IAB      Ectopic      Multiple      Live Births  1           SH:   Social History       Tobacco Use  . Smoking status: Never Smoker  . Smokeless tobacco: Never Used  Vaping Use  . Vaping Use: Never used  Substance Use Topics  . Alcohol use: Never  . Drug use: Never    FH:         Family History  Problem Relation Age of Onset  . Constipation Mother        died at age 85, "blockage", ended up with colostomy but no cancer  . Breast cancer Mother   . Suicidality Daughter        Died Dec 19, 2016  . Cancer Maternal Grandfather   . Throat cancer Maternal Grandfather   . Cancer Father   . Prostate cancer Father   . Liver cancer Father   . Heart attack Brother   . Stroke Brother   . Prostate cancer Brother   . Colon cancer Neg Hx      Allergies:       Allergies  Allergen Reactions  . Trimethoprim Swelling, Rash and Other (See Comments)    Tongue swelling    Medications:       Current Outpatient Medications:  .  amLODipine (NORVASC) 5 MG tablet, Take 5 mg by mouth daily. , Disp: , Rfl:  .   cephALEXin (KEFLEX) 500 MG capsule, TAKE 1 CAPSULE BY MOUTH THREE TIMES A DAY, Disp: , Rfl:  .  cyclobenzaprine (FLEXERIL) 10 MG tablet, , Disp: , Rfl:  .  FLUoxetine (PROZAC) 20 MG capsule, , Disp: , Rfl:  .  FLUoxetine (PROZAC) 40 MG capsule, Take 40 mg by mouth daily. , Disp: , Rfl:  .  fluticasone (FLONASE) 50 MCG/ACT nasal spray, Place 2 sprays into both nostrils daily., Disp: , Rfl:  .  hydrochlorothiazide (HYDRODIURIL) 25 MG tablet, Take 25 mg by mouth daily. , Disp: , Rfl:  .  HYDROcodone-acetaminophen (NORCO/VICODIN) 5-325 MG tablet, Take 2 tablets by mouth every 4 (four) hours as needed., Disp: 6 tablet, Rfl: 0 .  ibuprofen (ADVIL,MOTRIN) 800 MG tablet, Take 800  mg by mouth every 6 (six) hours as needed for headache or moderate pain., Disp: , Rfl:  .  levothyroxine (SYNTHROID, LEVOTHROID) 50 MCG tablet, TAKE 1 TABLET BY MOUTH IN THE MORNING., Disp: 30 tablet, Rfl: 2 .  metFORMIN (GLUCOPHAGE) 500 MG tablet, Take by mouth 2 (two) times daily with a meal., Disp: , Rfl:  .  ondansetron (ZOFRAN ODT) 4 MG disintegrating tablet, Take 1 tablet (4 mg total) by mouth every 8 (eight) hours as needed for nausea., Disp: 10 tablet, Rfl: 0 .  potassium chloride SA (K-DUR) 20 MEQ tablet, , Disp: , Rfl:  .  pravastatin (PRAVACHOL) 20 MG tablet, Take 20 mg by mouth daily., Disp: , Rfl:   Review of Systems:   Review of Systems  Constitutional: Negative for fever, chills, weight loss, malaise/fatigue and diaphoresis.  HENT: Negative for hearing loss, ear pain, nosebleeds, congestion, sore throat, neck pain, tinnitus and ear discharge.   Eyes: Negative for blurred vision, double vision, photophobia, pain, discharge and redness.  Respiratory: Negative for cough, hemoptysis, sputum production, shortness of breath, wheezing and stridor.   Cardiovascular: Negative for chest pain, palpitations, orthopnea, claudication, leg swelling and PND.  Gastrointestinal: Positive for abdominal pain. Negative for  heartburn, nausea, vomiting, diarrhea, constipation, blood in stool and melena.  Genitourinary: Negative for dysuria, urgency, frequency, hematuria and flank pain.  Musculoskeletal: Negative for myalgias, back pain, joint pain and falls.  Skin: Negative for itching and rash.  Neurological: Negative for dizziness, tingling, tremors, sensory change, speech change, focal weakness, seizures, loss of consciousness, weakness and headaches.  Endo/Heme/Allergies: Negative for environmental allergies and polydipsia. Does not bruise/bleed easily.  Psychiatric/Behavioral: Negative for depression, suicidal ideas, hallucinations, memory loss and substance abuse. The patient is not nervous/anxious and does not have insomnia.      PHYSICAL EXAM:  There were no vitals taken for this visit.    Vitals reviewed. Constitutional: She is oriented to person, place, and time. She appears well-developed and well-nourished.  HENT:  Head: Normocephalic and atraumatic.  Right Ear: External ear normal.  Left Ear: External ear normal.  Nose: Nose normal.  Mouth/Throat: Oropharynx is clear and moist.  Eyes: Conjunctivae and EOM are normal. Pupils are equal, round, and reactive to light. Right eye exhibits no discharge. Left eye exhibits no discharge. No scleral icterus.  Neck: Normal range of motion. Neck supple. No tracheal deviation present. No thyromegaly present.  Cardiovascular: Normal rate, regular rhythm, normal heart sounds and intact distal pulses.  Exam reveals no gallop and no friction rub.   No murmur heard. Respiratory: Effort normal and breath sounds normal. No respiratory distress. She has no wheezes. She has no rales. She exhibits no tenderness.  GI: Soft. Bowel sounds are normal. She exhibits no distension and no mass. There is tenderness. There is no rebound and no guarding.  Genitourinary:       Vulva is normal without lesions Vagina is pink moist without discharge Cervix normal in  appearance and pap is normal Uterus is uterus absent Adnexa iper sonogram Musculoskeletal: Normal range of motion. She exhibits no edema and no tenderness.  Neurological: She is alert and oriented to person, place, and time. She has normal reflexes. She displays normal reflexes. No cranial nerve deficit. She exhibits normal muscle tone. Coordination normal.  Skin: Skin is warm and dry. No rash noted. No erythema. No pallor.  Psychiatric: She has a normal mood and affect. Her behavior is normal. Judgment and thought content normal.    Labs:  Results for orders placed or performed in visit on 12/07/19 (from the past 336 hour(s))  CA 125   Collection Time: 12/07/19 12:15 PM  Result Value Ref Range   Cancer Antigen (CA) 125 33.8 0.0 - 38.1 U/mL  Results for orders placed or performed during the hospital encounter of 12/05/19 (from the past 336 hour(s))  Lipase, blood   Collection Time: 12/05/19  7:15 PM  Result Value Ref Range   Lipase 40 11 - 51 U/L  Comprehensive metabolic panel   Collection Time: 12/05/19  7:15 PM  Result Value Ref Range   Sodium 136 135 - 145 mmol/L   Potassium 3.4 (L) 3.5 - 5.1 mmol/L   Chloride 98 98 - 111 mmol/L   CO2 27 22 - 32 mmol/L   Glucose, Bld 172 (H) 70 - 99 mg/dL   BUN 12 8 - 23 mg/dL   Creatinine, Ser 0.89 0.44 - 1.00 mg/dL   Calcium 9.4 8.9 - 10.3 mg/dL   Total Protein 8.7 (H) 6.5 - 8.1 g/dL   Albumin 3.6 3.5 - 5.0 g/dL   AST 24 15 - 41 U/L   ALT 25 0 - 44 U/L   Alkaline Phosphatase 91 38 - 126 U/L   Total Bilirubin 0.3 0.3 - 1.2 mg/dL   GFR, Estimated >60 >60 mL/min   Anion gap 11 5 - 15  CBC with Differential/Platelet   Collection Time: 12/05/19  7:15 PM  Result Value Ref Range   WBC 6.9 4.0 - 10.5 K/uL   RBC 4.64 3.87 - 5.11 MIL/uL   Hemoglobin 13.4 12.0 - 15.0 g/dL   HCT 41.4 36.0 - 46.0 %   MCV 89.2 80.0 - 100.0 fL   MCH 28.9 26.0 - 34.0 pg   MCHC 32.4 30.0 - 36.0 g/dL   RDW 13.7 11.5 - 15.5 %    Platelets 250 150 - 400 K/uL   nRBC 0.0 0.0 - 0.2 %   Neutrophils Relative % 59 %   Neutro Abs 4.2 1.7 - 7.7 K/uL   Lymphocytes Relative 29 %   Lymphs Abs 2.0 0.7 - 4.0 K/uL   Monocytes Relative 5 %   Monocytes Absolute 0.3 0.1 - 1.0 K/uL   Eosinophils Relative 5 %   Eosinophils Absolute 0.4 0.0 - 0.5 K/uL   Basophils Relative 1 %   Basophils Absolute 0.1 0.0 - 0.1 K/uL   Immature Granulocytes 1 %   Abs Immature Granulocytes 0.06 0.00 - 0.07 K/uL  Troponin I (High Sensitivity)   Collection Time: 12/05/19  9:33 PM  Result Value Ref Range   Troponin I (High Sensitivity) 5 <18 ng/L  Urinalysis, Routine w reflex microscopic Urine, Clean Catch   Collection Time: 12/05/19  9:35 PM  Result Value Ref Range   Color, Urine YELLOW YELLOW   APPearance CLEAR CLEAR   Specific Gravity, Urine 1.041 (H) 1.005 - 1.030   pH 7.0 5.0 - 8.0   Glucose, UA NEGATIVE NEGATIVE mg/dL   Hgb urine dipstick NEGATIVE NEGATIVE   Bilirubin Urine NEGATIVE NEGATIVE   Ketones, ur NEGATIVE NEGATIVE mg/dL   Protein, ur NEGATIVE NEGATIVE mg/dL   Nitrite NEGATIVE NEGATIVE   Leukocytes,Ua NEGATIVE NEGATIVE    Results for orders placed or performed during the hospital encounter of 01/25/20 (from the past 48 hour(s))  CBC     Status: None   Collection Time: 01/25/20 10:34 AM  Result Value Ref Range   WBC 7.4 4.0 - 10.5 K/uL   RBC 4.84 3.87 -  5.11 MIL/uL   Hemoglobin 13.9 12.0 - 15.0 g/dL   HCT 43.3 36.0 - 46.0 %   MCV 89.5 80.0 - 100.0 fL   MCH 28.7 26.0 - 34.0 pg   MCHC 32.1 30.0 - 36.0 g/dL   RDW 13.1 11.5 - 15.5 %   Platelets 279 150 - 400 K/uL   nRBC 0.0 0.0 - 0.2 %    Comment: Performed at [facility], [address], [place], [place] [address]  Comprehensive metabolic panel     Status: Abnormal   Collection Time: 01/25/20 10:34 AM  Result Value Ref Range   Sodium 135 135 - 145 mmol/L   Potassium 3.7 3.5 - 5.1 mmol/L   Chloride 97 (L) 98 - 111 mmol/L   CO2 27 22 - 32  mmol/L   Glucose, Bld 191 (H) 70 - 99 mg/dL    Comment: Glucose reference range applies only to samples taken after fasting for at least 8 hours.   BUN 17 8 - 23 mg/dL   Creatinine, Ser 0.79 0.44 - 1.00 mg/dL   Calcium 9.4 8.9 - 10.3 mg/dL   Total Protein 8.5 (H) 6.5 - 8.1 g/dL   Albumin 3.5 3.5 - 5.0 g/dL   AST 23 15 - 41 U/L   ALT 26 0 - 44 U/L   Alkaline Phosphatase 84 38 - 126 U/L   Total Bilirubin 0.4 0.3 - 1.2 mg/dL   GFR, Estimated >60 >60 mL/min    Comment: (NOTE) Calculated using the CKD-EPI Creatinine Equation (2021)    Anion gap 11 5 - 15    Comment: Performed at [facility], [address], [place], [place] [address]  Type and screen     Status: None   Collection Time: 01/25/20 10:34 AM  Result Value Ref Range   ABO/RH(D) B POS    Antibody Screen NEG    Sample Expiration 02/08/2020,2359    Extend sample reason      NO TRANSFUSIONS OR PREGNANCY IN THE PAST 3 MONTHS Performed at [facility], [address], [place], [place] [address]   Rapid HIV screen (HIV 1/2 Ab+Ag)     Status: None   Collection Time: 01/25/20 10:34 AM  Result Value Ref Range   HIV-1 P24 Antigen - HIV24 NON REACTIVE NON REACTIVE    Comment: (NOTE) Detection of p24 may be inhibited by biotin in the sample, causing false negative results in acute infection.    HIV 1/2 Antibodies NON REACTIVE NON REACTIVE   Interpretation (HIV Ag Ab)      A non reactive test result means that HIV 1 or HIV 2 antibodies and HIV 1 p24 antigen were not detected in the specimen.    Comment: Performed at [facility], [address] [place], [place] [address]  SARS CORONAVIRUS 2 (TAT 6-24 HRS) Nasopharyngeal Nasopharyngeal Swab     Status: None   Collection Time: 01/25/20 10:49 AM   Specimen: Nasopharyngeal Swab  Result Value Ref Range   SARS Coronavirus 2 NEGATIVE NEGATIVE    Comment: (NOTE) SARS-CoV-2 target nucleic acids are NOT DETECTED.  The SARS-CoV-2 RNA is generally detectable in upper and lower respiratory  specimens during the acute phase of infection. Negative results do not preclude SARS-CoV-2 infection, do not rule out co-infections with other pathogens, and should not be used as the sole basis for treatment or other patient management decisions. Negative results must be combined with clinical observations, patient history, and epidemiological information. The expected result is Negative.  Fact Sheet  for Patients: SugarRoll.be  Fact Sheet for Healthcare Providers: https://www.woods-mathews.com/  This test is not yet approved or cleared by the [place] FDA and  has been authorized for detection and/or diagnosis of SARS-CoV-2 by FDA under an Emergency Use Authorization (EUA). This EUA will remain  in effect (meaning this test can be used) for the duration of the COVID-19 declaration under Se ction 564(b)(1) of the Act, 21 U.S.C. section 360bbb-3(b)(1), unless the authorization is terminated or revoked sooner.  Performed at [facility], [place] [address], [place], [place] [address]   Urinalysis, Routine w reflex microscopic Urine, Clean Catch     Status: Abnormal   Collection Time: 01/25/20 10:49 AM  Result Value Ref Range   Color, Urine YELLOW YELLOW   APPearance HAZY (A) CLEAR   Specific Gravity, Urine 1.020 1.005 - 1.030   pH 5.0 5.0 - 8.0   Glucose, UA NEGATIVE NEGATIVE mg/dL   Hgb urine dipstick NEGATIVE NEGATIVE   Bilirubin Urine NEGATIVE NEGATIVE   Ketones, ur NEGATIVE NEGATIVE mg/dL   Protein, ur NEGATIVE NEGATIVE mg/dL   Nitrite POSITIVE (A) NEGATIVE   Leukocytes,Ua NEGATIVE NEGATIVE   RBC / HPF 0-5 0 - 5 RBC/hpf   WBC, UA 0-5 0 - 5 WBC/hpf   Bacteria, UA MANY (A) NONE SEEN   Squamous Epithelial / LPF 0-5 0 - 5   Mucus PRESENT    Hyaline Casts, UA PRESENT     Comment: Performed at [facility], [address] [place], [place] [address]    EKG:    Orders placed or performed during the hospital encounter of  12/05/19  . EKG 12-Lead  . EKG 12-Lead  . EKG 12-Lead  . EKG 12-Lead  . EKG    Imaging Studies:  Imaging Results  CT ABDOMEN PELVIS W CONTRAST  Result Date: 12/05/2019 CLINICAL DATA:  Epigastric pain and vomiting for several hours EXAM: CT ABDOMEN AND PELVIS WITH CONTRAST TECHNIQUE: Multidetector CT imaging of the abdomen and pelvis was performed using the standard protocol following bolus administration of intravenous contrast. CONTRAST:  161mL OMNIPAQUE IOHEXOL 300 MG/ML  SOLN COMPARISON:  None. FINDINGS: Lower chest: No acute abnormality. Hepatobiliary: Multiple gallstones are noted within a well distended gallbladder. No obstructive changes are seen. Liver is within normal limits. Common bile duct is unremarkable. Pancreas: Unremarkable. No pancreatic ductal dilatation or surrounding inflammatory changes. Spleen: Spleen demonstrates a focal hypodensity best seen on image number 28 of series 2 incompletely evaluated on this exam likely representing a hemangioma. Adrenals/Urinary Tract: Adrenal glands are within normal limits. Kidneys demonstrate 1.6 cm cyst in the upper pole of the left kidney no obstructing stones are seen although evaluation is limited due to significant contrast enhancement within the kidneys at the time of imaging. The bladder is within normal limits. Stomach/Bowel: Scattered diverticular change of the colon is noted primarily within the sigmoid colon. No obstructive or inflammatory changes are seen. No findings of diverticulitis are noted. The appendix has been surgically removed. The stomach and small bowel appear within normal limits. Vascular/Lymphatic: No significant vascular findings are present. No enlarged abdominal or pelvic lymph nodes. Reproductive: Uterus has been surgically removed. To the left of the midline there is somewhat cystic lesion identified measuring 2.3 cm. A large partially cystic lesion is noted to the right of the midline measuring 7.3 x 6.7 cm  with some mixed attenuation within. Other: No abdominal wall hernia or abnormality. No abdominopelvic ascites. Musculoskeletal: Degenerative changes of lumbar spine are noted. No  other focal abnormality is seen. IMPRESSION: Bilateral somewhat cystic lesions within the ovaries. Largest of these is noted on the right measuring up to 7.3 cm. Given the size in the postmenopausal state, neoplasm is highly considered. Transvaginal ultrasound of the pelvis may be helpful for further evaluation. Alternatively nonemergent MRI could be performed for further evaluation. Cholelithiasis without complicating factors. Hypodensity within the spleen likely representing a hemangioma but incompletely evaluated on this exam. Diverticulosis without diverticulitis. Electronically Signed   By: Inez Catalina M.D.   On: 12/05/2019 20:53       Assessment: Bilateral pelvic masses, probably ovarian in origin      Patient Active Problem List   Diagnosis Date Noted  . Heme positive stool 05/07/2017    Plan: Laparoscopic removal of bilateral pelvic masses, appear benign, probable ovarian in origin  Florian Buff 12/16/2019 12:42 PM

## 2020-01-26 NOTE — Discharge Instructions (Signed)
Do not take hydrocodone and oxycodone at the same time, use on or the other not both Do not take ibuprofen and toradol(ketorolac) at the same time, one or the other      Bilateral Salpingo-Oophorectomy, Care After This sheet gives you information about how to care for yourself after your procedure. Your health care provider may also give you more specific instructions. If you have problems or questions, contact your health care provider. What can I expect after the procedure? After the procedure, it is common to have:  Abdominal pain.  Some occasional vaginal bleeding (spotting).  Tiredness.  Symptoms of menopause, such as hot flashes, night sweats, or mood swings. Follow these instructions at home: Incision care  Keep your incision area and your bandage (dressing) clean and dry.  Follow instructions from your health care provider about how to take care of your incision. Make sure you: ? Wash your hands with soap and water before you change your dressing. If soap and water are not available, use hand sanitizer. ? Change your dressing as told by your health care provider. ? Leave stitches (sutures), staples, skin glue, or adhesive strips in place. These skin closures may need to stay in place for 2 weeks or longer. If adhesive strip edges start to loosen and curl up, you may trim the loose edges. Do not remove adhesive strips completely unless your health care provider tells you to do that.  Check your incision area every day for signs of infection. Check for: ? Redness, swelling, or pain. ? Fluid or blood. ? Warmth. ? Pus or a bad smell.   Activity  Do not drive or use heavy machinery while taking prescription pain medicine.  Do not drive for 24 hours if you received a medicine to help you relax (sedative) during your procedure.  Take frequent, short walks throughout the day. Rest when you get tired. Ask your health care provider what activities are safe for you.  Avoid  activity that requires great effort. Also, avoid heavy lifting. Do not lift anything that is heavier than 10 lbs. (4.5 kg), or the limit that your health care provider tells you, until he or she says that it is safe to do so.  Do not douche, use tampons, or have sex until your health care provider approves.   General instructions  To prevent or treat constipation while you are taking prescription pain medicine, your health care provider may recommend that you: ? Drink enough fluid to keep your urine clear or pale yellow. ? Take over-the-counter or prescription medicines. ? Eat foods that are high in fiber, such as fresh fruits and vegetables, whole grains, and beans. ? Limit foods that are high in fat and processed sugars, such as fried and sweet foods.  Take over-the-counter and prescription medicines only as told by your health care provider.  Do not take baths, swim, or use a hot tub until your health care provider approves. Ask your health care provider if you can take showers. You may only be allowed to take sponge baths for bathing.  Wear compression stockings as told by your health care provider. These stockings help to prevent blood clots and reduce swelling in your legs.  Keep all follow-up visits as told by your health care provider. This is important.   Contact a health care provider if:  You have pain when you urinate.  You have pus or a bad smelling discharge coming from your vagina.  You have redness, swelling, or pain  around your incision.  You have fluid or blood coming from your incision.  Your incision feels warm to the touch.  You have pus or a bad smell coming from your incision.  You have a fever.  Your incision starts to break open.  You have pain in the abdomen, and it gets worse or does not get better when you take medicine.  You develop a rash.  You develop nausea and vomiting.  You feel lightheaded. Get help right away if:  You develop pain in  your chest or leg.  You become short of breath.  You faint.  You have increased bleeding from your vagina. Summary  After the procedure, it is common to have pain, bleeding in the vagina, and symptoms of menopause.  Follow instructions from your health care provider about how to take care of your incision.  Follow instructions from your health care provider about activities and restrictions.  Check your incision every day for signs of infection and report any symptoms to your health care provider. This information is not intended to replace advice given to you by your health care provider. Make sure you discuss any questions you have with your health care provider. Document Revised: 02/27/2018 Document Reviewed: 01/29/2016 Elsevier Patient Education  2021 [place]. Minimally Invasive Cholecystectomy, Care After This sheet gives you information about how to care for yourself after your procedure. Your doctor may also give you more specific instructions. If you have problems or questions, contact your doctor. What can I expect after the procedure? After the procedure, it is common:  To have pain at the areas of surgery. You will be given medicines for pain.  To vomit or feel like you may vomit.  To feel fullness in the belly (bloating) or to have pain in the shoulder. This comes from the gas that was used during the surgery. Follow these instructions at home: Medicines  Take over-the-counter and prescription medicines only as told by your doctor.  If you were prescribed an antibiotic medicine, take it as told by your doctor. Do not stop using the antibiotic even if you start to feel better.  Ask your doctor if the medicine prescribed to you: ? Requires you to avoid driving or using machinery. ? Can cause trouble pooping (constipation). You may need to take these actions to prevent or treat trouble pooping:  Drink enough fluid to keep your pee (urine) pale yellow.  Take  over-the-counter or prescription medicines.  Eat foods that are high in fiber. These include beans, whole grains, and fresh fruits and vegetables.  Limit foods that are high in fat and sugar. These include fried or sweet foods. Incision care  Follow instructions from your doctor about how to take care of your cuts from surgery (incisions). Make sure you: ? Wash your hands with soap and water for at least 20 seconds before and after you change your bandage (dressing). If you cannot use soap and water, use hand sanitizer. ? Change your bandage as told by your doctor. ? Leave stitches (sutures), skin glue, or skin tape (adhesive) strips in place. They may need to stay in place for 2 weeks or longer. If tape strips get loose and curl up, you may trim the loose edges. Do not remove tape strips completely unless your doctor says it is okay.  Do not take baths, swim, or use a hot tub until your doctor approves. Ask your doctor if you may take showers. You may only be allowed to  take sponge baths.  Check your surgery area every day for signs of infection. Check for: ? More redness, swelling, or pain. ? Fluid or blood. ? Warmth. ? Pus or a bad smell.   Activity  Rest as told by your doctor.  Do not sit for a long time without moving. Get up to take short walks every 1-2 hours. This is important. Ask for help if you feel weak or unsteady.  Do not lift anything that is heavier than 10 lb (4.5 kg), or the limit that you are told, until your doctor says that it is safe.  Do not play contact sports until your doctor says it is okay.  Do not return to work or school until your doctor says it is okay.  Return to your normal activities as told by your doctor. Ask your doctor what activities are safe for you. General instructions  If you were given a medicine to help you relax (sedative) during your procedure, it can affect you for many hours. Do not drive or use machinery until your doctor says that  it is safe.  Keep all follow-up visits as told by your doctor. This is important. Contact a doctor if:  You get a rash.  You have more redness, swelling, or pain around your cuts from surgery.  You have fluid or blood coming from your cuts from surgery.  Your cuts from surgery feel warm to the touch.  You have pus or a bad smell coming from your cuts from surgery.  You have a fever.  One or more of your cuts from surgery breaks open. Get help right away if:  You have trouble breathing.  You have chest pain.  You have pain that is getting worse in your shoulders.  You faint or feel dizzy when you stand.  You have very bad pain in your belly (abdomen).  You feel like you may vomit or you vomit, and this lasts for more than one day.  You have leg pain. Summary  After your surgery, it is common to have pain at the areas of surgery. You may also have vomiting or fullness in the belly.  Follow your doctor's instructions about medicine, activity restrictions, and caring for your surgery areas. Do not do activities that require a lot of effort.  Contact a doctor if you have a fever or other signs of infection, such as more redness, swelling, or pain around the cuts from surgery.  Get help right away if you have chest pain, increasing pain in the shoulders, or trouble breathing. This information is not intended to replace advice given to you by your health care provider. Make sure you discuss any questions you have with your health care provider. Document Revised: 12/08/2018 Document Reviewed: 12/08/2018 Elsevier Patient Education  2021 [employer] Inc[employer]   [place] [facility] [place] [facility] [place] UNTIL Sunday January 29, 2020.  DO NOT USE ANY ADDITIONAL NUMBING MEDICATIONS UNTIL AFTER Sunday.   Bupivacaine Liposomal Suspension for Injection What is this medicine? BUPIVACAINE LIPOSOMAL (bue PIV a kane LIP oh som al) is an anesthetic. It causes loss of feeling in the skin or other  tissues. It is used to prevent and to treat pain from some procedures. This medicine may be used for other purposes; ask your health care provider or pharmacist if you have questions. COMMON BRAND NAME(S): EXPAREL What should I tell my health care provider before I take this medicine? They need to know if you have any of these  conditions:  G6PD deficiency  heart disease  kidney disease  liver disease  low blood pressure  lung or breathing disease, like asthma  an unusual or allergic reaction to bupivacaine, other medicines, foods, dyes, or preservatives  pregnant or trying to get pregnant  breast-feeding How should I use this medicine? This medicine is injected into the affected area. It is given by a health care provider in a hospital or clinic setting. Talk to your health care provider about the use of this medicine in children. While it may be given to children as young as 6 years for selected conditions, precautions do apply. Overdosage: If you think you have taken too much of this medicine contact a poison control center or emergency room at once. NOTE: This medicine is only for you. Do not share this medicine with others. What if I miss a dose? This does not apply. What may interact with this medicine? This medicine may interact with the following medications:  acetaminophen  certain antibiotics like dapsone, nitrofurantoin, aminosalicylic acid, sulfonamides  certain medicines for seizures like phenobarbital, phenytoin, valproic acid  chloroquine  cyclophosphamide  flutamide  hydroxyurea  ifosfamide  metoclopramide  nitric oxide  nitroglycerin  nitroprusside  nitrous oxide  other local anesthetics like lidocaine, pramoxine, tetracaine  primaquine  quinine  rasburicase  sulfasalazine This list may not describe all possible interactions. Give your health care provider a list of all the medicines, herbs, non-prescription drugs, or dietary  supplements you use. Also tell them if you smoke, drink alcohol, or use illegal drugs. Some items may interact with your medicine. What should I watch for while using this medicine? Your condition will be monitored carefully while you are receiving this medicine. Be careful to avoid injury while the area is numb, and you are not aware of pain. What side effects may I notice from receiving this medicine? Side effects that you should report to your doctor or health care professional as soon as possible:  allergic reactions like skin rash, itching or hives, swelling of the face, lips, or tongue  seizures  signs and symptoms of a dangerous change in heartbeat or heart rhythm like chest pain; dizziness; fast, irregular heartbeat; palpitations; feeling faint or lightheaded; falls; breathing problems  signs and symptoms of methemoglobinemia such as pale, gray, or blue colored skin; headache; fast heartbeat; shortness of breath; feeling faint or lightheaded, falls; tiredness Side effects that usually do not require medical attention (report to your doctor or health care professional if they continue or are bothersome):  anxious  back pain  changes in taste  changes in vision  constipation  dizziness  fever  nausea, vomiting This list may not describe all possible side effects. Call your doctor for medical advice about side effects. You may report side effects to FDA at 1-800-FDA-1088. Where should I keep my medicine? This drug is given in a hospital or clinic and will not be stored at home. NOTE: This sheet is a summary. It may not cover all possible information. If you have questions about this medicine, talk to your doctor, pharmacist, or health care provider.  2021 Elsevier/Gold Standard (2019-04-01 12:24:57)     General Anesthesia, Adult, Care After This sheet gives you information about how to care for yourself after your procedure. Your health care provider may also give you  more specific instructions. If you have problems or questions, contact your health care provider. What can I expect after the procedure? After the procedure, the following side effects are  common:  Pain or discomfort at the IV site.  Nausea.  Vomiting.  Sore throat.  Trouble concentrating.  Feeling cold or chills.  Feeling weak or tired.  Sleepiness and fatigue.  Soreness and body aches. These side effects can affect parts of the body that were not involved in surgery. Follow these instructions at home: For the time period you were told by your health care provider:  Rest.  Do not participate in activities where you could fall or become injured.  Do not drive or use machinery.  Do not drink alcohol.  Do not take sleeping pills or medicines that cause drowsiness.  Do not make important decisions or sign legal documents.  Do not take care of children on your own.   Eating and drinking  Follow any instructions from your health care provider about eating or drinking restrictions.  When you feel hungry, start by eating small amounts of foods that are soft and easy to digest (bland), such as toast. Gradually return to your regular diet.  Drink enough fluid to keep your urine pale yellow.  If you vomit, rehydrate by drinking water, juice, or clear broth. General instructions  If you have sleep apnea, surgery and certain medicines can increase your risk for breathing problems. Follow instructions from your health care provider about wearing your sleep device: ? Anytime you are sleeping, including during daytime naps. ? While taking prescription pain medicines, sleeping medicines, or medicines that make you drowsy.  Have a responsible adult stay with you for the time you are told. It is important to have someone help care for you until you are awake and alert.  Return to your normal activities as told by your health care provider. Ask your health care provider what  activities are safe for you.  Take over-the-counter and prescription medicines only as told by your health care provider.  If you smoke, do not smoke without supervision.  Keep all follow-up visits as told by your health care provider. This is important. Contact a health care provider if:  You have nausea or vomiting that does not get better with medicine.  You cannot eat or drink without vomiting.  You have pain that does not get better with medicine.  You are unable to pass urine.  You develop a skin rash.  You have a fever.  You have redness around your IV site that gets worse. Get help right away if:  You have difficulty breathing.  You have chest pain.  You have blood in your urine or stool, or you vomit blood. Summary  After the procedure, it is common to have a sore throat or nausea. It is also common to feel tired.  Have a responsible adult stay with you for the time you are told. It is important to have someone help care for you until you are awake and alert.  When you feel hungry, start by eating small amounts of foods that are soft and easy to digest (bland), such as toast. Gradually return to your regular diet.  Drink enough fluid to keep your urine pale yellow.  Return to your normal activities as told by your health care provider. Ask your health care provider what activities are safe for you. This information is not intended to replace advice given to you by your health care provider. Make sure you discuss any questions you have with your health care provider. Document Revised: 09/09/2019 Document Reviewed: 04/08/2019 Elsevier Patient Education  2021 [employer].  Ketorolac Oral Tablets What is this medicine? KETOROLAC (kee toe ROLE ak) is a non-steroidal anti-inflammatory drug, also known as an NSAID. It treats pain, inflammation, and swelling. This medicine may be used for other purposes; ask your health care provider or pharmacist if you have  questions. COMMON BRAND NAME(S): Toradol What should I tell my health care provider before I take this medicine? They need to know if you have any of these conditions:  bleeding disorder  coronary artery bypass graft (CABG) within the past 2 weeks  heart attack  heart disease  heart failure  high blood pressure  if you often drink alcohol  kidney disease  liver disease  lung or breathing disease (asthma)  receiving steroids like dexamethasone or prednisone  smoke tobacco cigarettes  stomach bleeding  stomach ulcers, other stomach or intestine problems  take medicine to treat or prevent blood clots  an unusual or allergic reaction to ketorolac, other medicines, foods, dyes, or preservatives  pregnant or trying to get pregnant  breast-feeding How should I use this medicine? Take this medicine by mouth. Take it as directed on the prescription label at the same time every day. You can take it with or without food. If it upsets your stomach, take it with food. There may be unused or extra doses in the bottle after you finish the dosing cycle. Talk to your health care provider if you have questions about your dose. A special MedGuide will be given to you by the pharmacist with each prescription and refill. Be sure to read this information carefully each time. Talk to your health care provider about the use of this medicine in children. Special care may be needed. Patients over 76 years of age may have a stronger reaction and need a smaller dose. Overdosage: If you think you have taken too much of this medicine contact a poison control center or emergency room at once. NOTE: This medicine is only for you. Do not share this medicine with others. What if I miss a dose? If you miss a dose, skip it. Take your next dose at the normal time. Do not take extra or 2 doses at the same time to make up for the missed dose. What may interact with this medicine? Do not take this medicine  with any of the following medications:  aspirin and aspirin-like medicines  cidofovir  methotrexate  NSAIDs, medicines for pain and inflammation, like ibuprofen or naproxen  pemetrexed  probenecid This medicine may also interact with the following medications:  alcohol  alendronate  alprazolam  carbamazepine  cyclosporine  diuretics  flavocoxid  fluoxetine  ginkgo  lithium  medicines for high blood pressure like enalapril  medicines that affect platelets like pentoxifylline  medicines that treat or prevent blood clots like heparin, warfarin  muscle relaxants  phenytoin  steroid medicines like prednisone or cortisone  thiothixene This list may not describe all possible interactions. Give your health care provider a list of all the medicines, herbs, non-prescription drugs, or dietary supplements you use. Also tell them if you smoke, drink alcohol, or use illegal drugs. Some items may interact with your medicine. What should I watch for while using this medicine? Visit your health care provider for regular checks on your progress. Tell your health care provider if your symptoms do not start to get better or if they get worse. Do not use this medicine for more than 5 days. It is only used for short-term treatment of moderate to severe pain. The  risk of side effects such as kidney damage and stomach bleeding are higher if used for more than 5 days. Do not take other medicines that contain aspirin, ibuprofen, or naproxen with this medicine. Side effects such as stomach upset, nausea, or ulcers may be more likely to occur. Many non-prescription medicines contain aspirin, ibuprofen, or naproxen. Always read labels carefully. This medicine can cause serious ulcers and bleeding in the stomach. It can happen with no warning. Smoking, drinking alcohol, older age, and poor health can also increase risks. Call your health care provider right away if you have stomach pain or  blood in your vomit or stool. This medicine may cause serious skin reactions. They can happen weeks to months after starting the medicine. Contact your health care provider right away if you notice fevers or flu-like symptoms with a rash. The rash may be red or purple and then turn into blisters or peeling of the skin. Or, you might notice a red rash with swelling of the face, lips or lymph nodes in your neck or under your arms. This medicine does not prevent a heart attack or stroke. This medicine may increase the chance of a heart attack or stroke. The chance may increase the longer you use this medicine or if you have heart disease. If you take aspirin to prevent a heart attack or stroke, talk to your health care provider about using this medicine. You may get drowsy or dizzy. Do not drive, use machinery, or do anything that needs mental alertness until you know how this medicine affects you. Do not stand up or sit up quickly, especially if you are an older patient. This reduces the risk of dizzy or fainting spells. What side effects may I notice from receiving this medicine? Side effects that you should report to your doctor or health care professional as soon as possible:  allergic reactions (skin rash, itching or hives; swelling of the face, lips, or tongue)  bleeding (bloody or black, tarry stools; red or dark brown urine; spitting up blood or brown material that looks like coffee grounds; red spots on the skin; unusual bruising or bleeding from the eyes, gums, or nose)  heart attack (trouble breathing; pain or tightness in the chest, neck, back or arms; unusually weak or tired)  heart failure (trouble breathing; fast, irregular heartbeat; sudden weight gain; swelling of the ankles, feet, hands; unusually weak or tired)  kidney injury (trouble passing urine or change in the amount of urine)  liver injury (dark yellow or brown urine; general ill feeling or flu-like symptoms; loss of appetite,  right upper belly pain; unusually weak or tired, yellowing of the eyes or skin)  low red blood cell counts (trouble breathing; feeling faint; lightheaded, falls; unusually weak or tired)  rash, fever, and swollen lymph nodes  redness, blistering, peeling, or loosening of the skin, including inside the mouth  stroke (changes in vision; confusion; trouble speaking or understanding; severe headaches; sudden numbness or weakness of the face, arm or leg; trouble walking; dizziness; loss of balance or coordination) Side effects that usually do not require medical attention (report to your doctor or health care professional if they continue or are bothersome):  constipation  decreased hearing, ringing in the ears  diarrhea  headache  nausea  passing gas  stomach pain  upset stomach This list may not describe all possible side effects. Call your doctor for medical advice about side effects. You may report side effects to FDA at 1-800-FDA-1088. Where should  I keep my medicine? Keep out of the reach of children and pets. Store at room temperature between 15 and 30 degrees C (59 and 86 degrees F). Protect from moisture. Keep the container tightly closed. Protect from light. Get rid of any unused medicine after the expiration date. To get rid of medicines that are no longer needed or expired:  Take the medicine to a medicine take-back program. Check with your pharmacy or law enforcement to find a location.  If you cannot return the medicine, check the label or package insert to see if the medicine should be thrown out in the garbage or flushed down the toilet. If you are not sure, ask your health care provider. If it is safe to put in the trash, pour the medicine out of the container. Mix the medicine with cat litter, dirt, coffee grounds, or other unwanted substance. Seal the mixture in a bag or container. Put it in the trash. NOTE: This sheet is a summary. It may not cover all possible  information. If you have questions about this medicine, talk to your doctor, pharmacist, or health care provider.  2021 Elsevier/Gold Standard (2019-05-14 10:29:52)    Ondansetron oral dissolving tablet What is this medicine? ONDANSETRON (on DAN se tron) is used to treat nausea and vomiting caused by chemotherapy. It is also used to prevent or treat nausea and vomiting after surgery. This medicine may be used for other purposes; ask your health care provider or pharmacist if you have questions. COMMON BRAND NAME(S): Zofran ODT What should I tell my health care provider before I take this medicine? They need to know if you have any of these conditions:  heart disease  history of irregular heartbeat  liver disease  low levels of magnesium or potassium in the blood  an unusual or allergic reaction to ondansetron, granisetron, other medicines, foods, dyes, or preservatives  pregnant or trying to get pregnant  breast-feeding How should I use this medicine? These tablets are made to dissolve in the mouth. Do not try to push the tablet through the foil backing. With dry hands, peel away the foil backing and gently remove the tablet. Place the tablet in the mouth and allow it to dissolve, then swallow. While you may take these tablets with water, it is not necessary to do so. Talk to your pediatrician regarding the use of this medicine in children. Special care may be needed. Overdosage: If you think you have taken too much of this medicine contact a poison control center or emergency room at once. NOTE: This medicine is only for you. Do not share this medicine with others. What if I miss a dose? If you miss a dose, take it as soon as you can. If it is almost time for your next dose, take only that dose. Do not take double or extra doses. What may interact with this medicine? Do not take this medicine with any of the following medications:  apomorphine  certain medicines for fungal  infections like fluconazole, itraconazole, ketoconazole, posaconazole, voriconazole  cisapride  dronedarone  pimozide  thioridazine This medicine may also interact with the following medications:  carbamazepine  certain medicines for depression, anxiety, or psychotic disturbances  fentanyl  linezolid  MAOIs like Carbex, Eldepryl, Marplan, Nardil, and Parnate  methylene blue (injected into a vein)  other medicines that prolong the QT interval (cause an abnormal heart rhythm) like dofetilide, ziprasidone  phenytoin  rifampicin  tramadol This list may not describe all possible interactions. Give  your health care provider a list of all the medicines, herbs, non-prescription drugs, or dietary supplements you use. Also tell them if you smoke, drink alcohol, or use illegal drugs. Some items may interact with your medicine. What should I watch for while using this medicine? Check with your doctor or health care professional as soon as you can if you have any sign of an allergic reaction. What side effects may I notice from receiving this medicine? Side effects that you should report to your doctor or health care professional as soon as possible:  allergic reactions like skin rash, itching or hives, swelling of the face, lips, or tongue  breathing problems  confusion  dizziness  fast or irregular heartbeat  feeling faint or lightheaded, falls  fever and chills  loss of balance or coordination  seizures  sweating  swelling of the hands and feet  tightness in the chest  tremors  unusually weak or tired Side effects that usually do not require medical attention (report to your doctor or health care professional if they continue or are bothersome):  constipation or diarrhea  headache This list may not describe all possible side effects. Call your doctor for medical advice about side effects. You may report side effects to FDA at 1-800-FDA-1088. Where should I  keep my medicine? Keep out of the reach of children. Store between 2 and 30 degrees C (36 and 86 degrees F). Throw away any unused medicine after the expiration date. NOTE: This sheet is a summary. It may not cover all possible information. If you have questions about this medicine, talk to your doctor, pharmacist, or health care provider.  2021 Elsevier/Gold Standard (2017-12-16 07:14:10)

## 2020-01-26 NOTE — Anesthesia Preprocedure Evaluation (Addendum)
Anesthesia Evaluation  Patient identified by MRN, date of birth, ID band Patient awake    Reviewed: Allergy & Precautions, NPO status , Patient's Chart, lab work & pertinent test results  History of Anesthesia Complications (+) history of anesthetic complications  Airway Mallampati: II  TM Distance: >3 FB Neck ROM: Full    Dental  (+) Dental Advisory Given, Chipped, Missing,    Pulmonary shortness of breath and with exertion,    Pulmonary exam normal breath sounds clear to auscultation       Cardiovascular hypertension, Pt. on medications Normal cardiovascular exam Rhythm:Regular Rate:Normal  05-Dec-2019 21:27:48 [facility] ROUTINE RECORD Sinus rhythm Nonspecific T abnormalities, lateral leads since last tracing no significant change Confirmed by Noemi Chapel [phone]) on 12/05/2019 9:54:30 PM   Neuro/Psych PSYCHIATRIC DISORDERS Depression negative neurological ROS     GI/Hepatic Neg liver ROS, GERD  ,  Endo/Other  diabetes, Well Controlled, Type 2  Renal/GU negative Renal ROS  negative genitourinary   Musculoskeletal negative musculoskeletal ROS (+)   Abdominal   Peds negative pediatric ROS (+)  Hematology negative hematology ROS (+)   Anesthesia Other Findings   Reproductive/Obstetrics negative OB ROS                           Anesthesia Physical Anesthesia Plan  ASA: III  Anesthesia Plan: General   Post-op Pain Management:    Induction: Intravenous  PONV Risk Score and Plan: 4 or greater and Ondansetron, Dexamethasone and Midazolam  Airway Management Planned: Oral ETT  Additional Equipment:   Intra-op Plan:   Post-operative Plan: Extubation in OR  Informed Consent: I have reviewed the patients History and Physical, chart, labs and discussed the procedure including the risks, benefits and alternatives for the proposed anesthesia with the patient or  authorized representative who has indicated his/her understanding and acceptance.     Dental advisory given  Plan Discussed with: CRNA and Surgeon  Anesthesia Plan Comments:         Anesthesia Quick Evaluation

## 2020-01-27 ENCOUNTER — Encounter (HOSPITAL_COMMUNITY): Payer: Self-pay | Admitting: Obstetrics & Gynecology

## 2020-01-28 LAB — SURGICAL PATHOLOGY

## 2020-01-31 ENCOUNTER — Encounter: Payer: Self-pay | Admitting: Internal Medicine

## 2020-02-03 ENCOUNTER — Other Ambulatory Visit: Payer: Self-pay

## 2020-02-03 ENCOUNTER — Encounter: Payer: Self-pay | Admitting: Obstetrics & Gynecology

## 2020-02-03 ENCOUNTER — Ambulatory Visit (INDEPENDENT_AMBULATORY_CARE_PROVIDER_SITE_OTHER): Payer: Medicare HMO | Admitting: Obstetrics & Gynecology

## 2020-02-03 DIAGNOSIS — Z9889 Other specified postprocedural states: Secondary | ICD-10-CM

## 2020-02-03 NOTE — Progress Notes (Signed)
  HPI: Patient returns for routine postoperative follow-up having undergone laparoscopic BSO and cholecystectomy on 01/26/20.  The patient's immediate postoperative recovery has been unremarkable. Since hospital discharge the patient reports no problems, no complaints.   Current Outpatient Medications: amLODipine (NORVASC) 5 MG tablet, Take 5 mg by mouth daily. , Disp: , Rfl:  budesonide-formoterol (SYMBICORT) 80-4.5 MCG/ACT inhaler, Inhale 2 puffs into the lungs 2 (two) times daily as needed (asthma)., Disp: , Rfl:  Cholecalciferol (VITAMIN D3) 125 MCG (5000 UT) CAPS, Take 5,000 Units by mouth daily., Disp: , Rfl:  Cholecalciferol 125 MCG (5000 UT) capsule, 1 capsule, Disp: , Rfl:  cyclobenzaprine (FLEXERIL) 10 MG tablet, 1 tablet as needed, Disp: , Rfl:  FLUoxetine (PROZAC) 20 MG capsule, Take 20 mg by mouth daily., Disp: , Rfl:   FLUoxetine (PROZAC) 40 MG capsule, Take 40 mg by mouth daily. , Disp: , Rfl:  fluticasone (FLONASE) 50 MCG/ACT nasal spray, Place 2 sprays into both nostrils daily., Disp: , Rfl:  gabapentin (NEURONTIN) 100 MG capsule, 1 capsule, Disp: , Rfl:  hydrochlorothiazide (HYDRODIURIL) 25 MG tablet, Take 25 mg by mouth daily. , Disp: , Rfl:  HYDROcodone-acetaminophen (NORCO/VICODIN) 5-325 MG tablet, Take 2 tablets by mouth every 4 (four) hours as needed. (Patient taking differently: Take 2 tablets by mouth every 4 (four) hours as needed for severe pain.), Disp: 6 tablet, Rfl: 0 ibuprofen (ADVIL) 800 MG tablet, 1 tablet, Disp: , Rfl:  ibuprofen (ADVIL,MOTRIN) 800 MG tablet, Take 800 mg by mouth every 6 (six) hours as needed for headache or moderate pain., Disp: , Rfl:  Ibuprofen 200 MG CAPS, 1 tablet with food, Disp: , Rfl:  ketorolac (TORADOL) 10 MG tablet, Take 1 tablet (10 mg total) by mouth every 8 (eight) hours as needed., Disp: 15 tablet, Rfl: 0 levothyroxine (SYNTHROID, LEVOTHROID) 50 MCG tablet, TAKE 1 TABLET BY MOUTH IN THE MORNING. (Patient taking differently: Take  50 mcg by mouth daily before breakfast.), Disp: 30 tablet, Rfl: 2 ondansetron (ZOFRAN ODT) 4 MG disintegrating tablet, Take 1 tablet (4 mg total) by mouth every 8 (eight) hours as needed for nausea., Disp: 10 tablet, Rfl: 0 ondansetron (ZOFRAN ODT) 8 MG disintegrating tablet, Take 1 tablet (8 mg total) by mouth every 8 (eight) hours as needed for nausea or vomiting., Disp: 20 tablet, Rfl: 0 oxyCODONE-acetaminophen (PERCOCET) 5-325 MG tablet, Take 1-2 tablets by mouth every 4 (four) hours as needed for severe pain., Disp: 30 tablet, Rfl: 0 potassium chloride SA (K-DUR) 20 MEQ tablet, Take 20 mEq by mouth 2 (two) times daily., Disp: , Rfl:   No current facility-administered medications for this visit.    There were no vitals taken for this visit.  Physical Exam: Incisions x 6 are all healing well Some bruising but normal exam minimally tender  Diagnostic Tests:   Pathology: Serous cystadenofibroma of both ovaries Gallbladder normal  Impression:   ICD-10-CM   1. S/P Laparoscopic BSO and cholecystectomy(combined procedure), benign pathology  Z98.890       Plan: No follow up needed    Follow up: prn   Florian Buff, MD

## 2020-02-24 ENCOUNTER — Ambulatory Visit: Payer: Medicare HMO | Admitting: Nurse Practitioner

## 2020-03-07 DIAGNOSIS — J449 Chronic obstructive pulmonary disease, unspecified: Secondary | ICD-10-CM

## 2020-03-07 DIAGNOSIS — J189 Pneumonia, unspecified organism: Secondary | ICD-10-CM

## 2020-03-07 HISTORY — DX: Chronic obstructive pulmonary disease, unspecified: J44.9

## 2020-03-07 HISTORY — DX: Pneumonia, unspecified organism: J18.9

## 2020-04-05 ENCOUNTER — Other Ambulatory Visit: Payer: Self-pay

## 2020-04-05 ENCOUNTER — Ambulatory Visit: Payer: Medicare HMO | Admitting: Nurse Practitioner

## 2020-04-05 ENCOUNTER — Encounter: Payer: Self-pay | Admitting: Nurse Practitioner

## 2020-04-05 VITALS — BP 151/83 | HR 96 | Temp 96.9°F | Ht 63.0 in | Wt 236.8 lb

## 2020-04-05 DIAGNOSIS — K649 Unspecified hemorrhoids: Secondary | ICD-10-CM | POA: Insufficient documentation

## 2020-04-05 DIAGNOSIS — R195 Other fecal abnormalities: Secondary | ICD-10-CM

## 2020-04-05 DIAGNOSIS — K6289 Other specified diseases of anus and rectum: Secondary | ICD-10-CM | POA: Diagnosis not present

## 2020-04-05 MED ORDER — HYDROCORTISONE ACETATE 25 MG RE SUPP: 25.0000 mg | Freq: Two times a day (BID) | RECTAL | 2 refills | Status: DC

## 2020-04-05 NOTE — Patient Instructions (Signed)
Your health issues we discussed today were:   Positive Cologuard and previous blood in your stools: 1. As we discussed, we will schedule a colonoscopy at this time 2. Further recommendations to follow your colonoscopy 3. Let us know if you have any obvious or significant bleeding  Rectal discomfort: 1. Because you have known hemorrhoids I will send in Anusol suppositories to help with your rectal discomfort 2. If this is too expensive, call our office and we continue to do a rectal cream that you can apply just inside your rectum 3. Call us for any worsening or severe symptoms  Overall I recommend:  1. Continue other current medications 2. Return for follow-up based on recommendations made after your procedure, or as needed for symptoms 3. Call us for any questions or concerns   ---------------------------------------------------------------  I am glad you have gotten your COVID-19 vaccination!  Even though you are fully vaccinated you should continue to follow CDC and state/local guidelines.  ---------------------------------------------------------------   At [facility] we value your feedback. You may receive a survey about your visit today. Please share your experience as we strive to create trusting relationships with our patients to provide genuine, compassionate, quality care.  We appreciate your understanding and patience as we review any laboratory studies, imaging, and other diagnostic tests that are ordered as we care for you. Our office policy is 5 business days for review of these results, and any emergent or urgent results are addressed in a timely manner for your best interest. If you do not hear from our office in 1 week, please contact us.   We also encourage the use of MyChart, which contains your medical information for your review as well. If you are not enrolled in this feature, an access code is on this after visit summary for your convenience. Thank  you for allowing [place] to be involved in your care.  It was great to see you today!  I hope you have a great spring!!

## 2020-04-05 NOTE — Progress Notes (Signed)
Referring Provider: Alfonse Flavors Primary Care Physician:  Alfonse Flavors, MD Primary GI:  Dr. Gala Romney  Chief Complaint  Patient presents with  . +cologuard    Never had tcs. No fhcrc  . rectal burning    Occ, ?hemorrhoids    HPI:   Bethany Myers is a 70 y.o. female who presents on referral from primary care for positive Cologuard.  Patient was last seen in our office 05/07/2017 for heme positive stool.  At that time noted had never had a colonoscopy but does yearly Hemoccults that have been negative.  No overt bleeding, rare constipation.  Recommended colonoscopy.  The patient was scheduled for colonoscopy for 07/15/2017 but the day before she called to cancel because she was too tired to complete it as her husband had one that morning.  Today she states she is doing okay overall. She had a recent positive Cologuard. Denies hematochezia, melena. Has rectal discomfort intermittently with known hemorrhoids, but also recently fell on her tailbone. Recent BSO and cholecystectomy. No family history of CRC. Has regular, daily bowel movements consistent with Bristo 4. Rectal discomfort/burning is worse after a bowel movement. Tried Preparation H with no improvement, thinks it's because the issue is "inside". Discomfort is intermittent. Denies abdominal pain, N/V, melena, fever, chills, unintentional weight loss. Denies URI or flu-like symptoms. Denies loss of sense of taste or smell. The patient has received COVID-19 vaccination(s). They also had COVID-19 in January 2022. Denies chest pain, dyspnea, dizziness, lightheadedness, syncope, near syncope. Denies any other upper or lower GI symptoms.  Past Medical History:  Diagnosis Date  . Acid reflux disease   . Depression   . Diabetes mellitus without complication ([place])   . Goiter   . Hypertension     Past Surgical History:  Procedure Laterality Date  . ABDOMINAL HYSTERECTOMY    . APPENDECTOMY    . BACK SURGERY    .  cataracts    . CHOLECYSTECTOMY N/A 01/26/2020   Procedure: LAPAROSCOPIC CHOLECYSTECTOMY;  Surgeon: Virl Cagey, MD;  Location: [facility];  Service: General;  Laterality: N/A;  . LAPAROSCOPIC BILATERAL SALPINGO OOPHERECTOMY  01/26/2020   Procedure: LAPAROSCOPIC BILATERAL SALPINGO OOPHORECTOMY;  Surgeon: Florian Buff, MD;  Location: [facility];  Service: Gynecology;;  . TUBAL LIGATION      Current Outpatient Medications  Medication Sig Dispense Refill  . amLODipine (NORVASC) 5 MG tablet Take 5 mg by mouth daily.     . budesonide-formoterol (SYMBICORT) 80-4.5 MCG/ACT inhaler Inhale 2 puffs into the lungs 2 (two) times daily as needed (asthma).    . Cholecalciferol (VITAMIN D3) 125 MCG (5000 UT) CAPS Take 5,000 Units by mouth daily.    . Cholecalciferol 125 MCG (5000 UT) capsule 1 capsule    . cyclobenzaprine (FLEXERIL) 10 MG tablet 1 tablet as needed    . FLUoxetine (PROZAC) 20 MG capsule Take 20 mg by mouth daily.    Marland Kitchen FLUoxetine (PROZAC) 40 MG capsule Take 40 mg by mouth daily.     . fluticasone (FLONASE) 50 MCG/ACT nasal spray Place 2 sprays into both nostrils daily.    Marland Kitchen gabapentin (NEURONTIN) 100 MG capsule Take 100 mg by mouth daily.    . hydrochlorothiazide (HYDRODIURIL) 25 MG tablet Take 25 mg by mouth daily.     . hydrocortisone (ANUSOL-HC) 25 MG suppository Place 1 suppository (25 mg total) rectally every 12 (twelve) hours. 12 suppository 2  . Ibuprofen 200 MG CAPS Take 800 mg by mouth as  needed.    Marland Kitchen levothyroxine (SYNTHROID, LEVOTHROID) 50 MCG tablet TAKE 1 TABLET BY MOUTH IN THE MORNING. (Patient taking differently: Take 50 mcg by mouth daily before breakfast.) 30 tablet 2  . METFORMIN HCL PO Take by mouth.    . potassium chloride SA (K-DUR) 20 MEQ tablet Take 20 mEq by mouth 2 (two) times daily.     No current facility-administered medications for this visit.    Allergies as of 04/05/2020 - Review Complete 04/05/2020  Allergen Reaction Noted  . Trimethoprim Swelling, Rash,  and Other (See Comments) 05/07/2017    Family History  Problem Relation Age of Onset  . Constipation Mother        died at age 74, "blockage", ended up with colostomy but no cancer  . Breast cancer Mother   . Suicidality Daughter        Died January 01, 2017  . Cancer Maternal Grandfather   . Throat cancer Maternal Grandfather   . Cancer Father   . Prostate cancer Father   . Liver cancer Father   . Heart attack Brother   . Stroke Brother   . Prostate cancer Brother   . Colon cancer Neg Hx     Social History   Socioeconomic History  . Marital status: Married    Spouse name: Not on file  . Number of children: Not on file  . Years of education: Not on file  . Highest education level: Not on file  Occupational History  . Not on file  Tobacco Use  . Smoking status: Never Smoker  . Smokeless tobacco: Never Used  Vaping Use  . Vaping Use: Never used  Substance and Sexual Activity  . Alcohol use: Never  . Drug use: Never  . Sexual activity: Not Currently    Birth control/protection: Surgical  Other Topics Concern  . Not on file  Social History Narrative  . Not on file   Social Determinants of Health   Financial Resource Strain: Low Risk   . Difficulty of Paying Living Expenses: Not very hard  Food Insecurity: No Food Insecurity  . Worried About Charity fundraiser in the Last Year: Never true  . Ran Out of Food in the Last Year: Never true  Transportation Needs: No Transportation Needs  . Lack of Transportation (Medical): No  . Lack of Transportation (Non-Medical): No  Physical Activity: Unknown  . Days of Exercise per Week: Patient refused  . Minutes of Exercise per Session: Patient refused  Stress: Stress Concern Present  . Feeling of Stress : Very much  Social Connections: Unknown  . Frequency of Communication with Friends and Family: More than three times a week  . Frequency of Social Gatherings with Friends and Family: More than three times a week  . Attends  Religious Services: Patient refused  . Active Member of Clubs or Organizations: No  . Attends Archivist Meetings: Never  . Marital Status: Married    Subjective: Review of Systems  Constitutional: Negative for chills, fever, malaise/fatigue and weight loss.  HENT: Negative for congestion and sore throat.   Respiratory: Negative for cough and shortness of breath.   Cardiovascular: Negative for chest pain and palpitations.  Gastrointestinal: Negative for abdominal pain, blood in stool, constipation, diarrhea, heartburn, melena, nausea and vomiting.  Musculoskeletal: Negative for joint pain and myalgias.  Skin: Negative for rash.  Neurological: Negative for dizziness and weakness.  Endo/Heme/Allergies: Does not bruise/bleed easily.  Psychiatric/Behavioral: Negative for depression. The patient is not  nervous/anxious.   All other systems reviewed and are negative.    Objective: BP (!) 151/83   Pulse 96   Temp (!) 96.9 F (36.1 C) (Temporal)   Ht _0  (1.6 m)   Wt 236 lb 12.8 oz (107.4 kg)   BMI 41.95 kg/m  Physical Exam Vitals and nursing note reviewed.  Constitutional:      General: She is not in acute distress.    Appearance: Normal appearance. She is well-developed. She is obese. She is not ill-appearing, toxic-appearing or diaphoretic.  HENT:     Head: Normocephalic and atraumatic.     Nose: No congestion or rhinorrhea.  Eyes:     General: No scleral icterus. Cardiovascular:     Rate and Rhythm: Normal rate and regular rhythm.     Heart sounds: Normal heart sounds.  Pulmonary:     Effort: Pulmonary effort is normal. No respiratory distress.     Breath sounds: Normal breath sounds.  Abdominal:     General: Bowel sounds are normal.     Palpations: Abdomen is soft. There is no hepatomegaly, splenomegaly or mass.     Tenderness: There is no abdominal tenderness. There is no guarding or rebound.     Hernia: No hernia is present.    Skin:    General: Skin  is warm and dry.     Coloration: Skin is not jaundiced.     Findings: No rash.  Neurological:     General: No focal deficit present.     Mental Status: She is alert and oriented to person, place, and time.  Psychiatric:        Attention and Perception: Attention normal.        Mood and Affect: Mood normal.        Speech: Speech normal.        Behavior: Behavior normal.        Thought Content: Thought content normal.        Cognition and Memory: Cognition and memory normal.      Assessment:  Very pleasant 70 year old female presents to schedule a colonoscopy.  She was referred by primary care due to testing positive on Corgard.  In 2019 she also had rectal bleeding was recommended after colonoscopy, although she had to cancel because her husband had one the day before.  At this time she also notes rectal discomfort with known hemorrhoids and recent fall on her tailbone.  Positive Cologuard: Discussed need for colonoscopy given her positive results.  Denies overt hematochezia.  No family history of colorectal cancer.  She is agreeable to proceeding at this time and we will schedule.  Rectal discomfort: Known hemorrhoids and feels she has burning pain, worse after bowel movements despite regular/soft bowel movements.  She has had some increased frequency of stools with a recent cholecystectomy.  She has tried topical cream on the external rectum and which is not helped.  At this point I will send in Anusol suppositories.  If it is too expensive she will call us and we can send in Anusol rectal cream and we discussed application on the internal rectum.   Proceed with colonoscopy on propofol/MAC by Dr. Gala Romney in near future: the risks, benefits, and alternatives have been discussed with the patient in detail. The patient states understanding and desires to proceed.  ASA III  The patient is currently on Metformin.  We will hold this the day of her procedure. The patient is not on any other  anticoagulants, anxiolytics,  chronic pain medications, antidepressants, antidiabetics, or iron supplements.   Plan: 1. Colonoscopy as described above 2. Anusol suppositories 3. Call if suppositories are expensive and consent rectal cream instead 4. Follow-up based on post procedure recommendations    Thank you for allowing [place] to participate in the care of Gillian Scarce, DNP, AGNP-C Adult & Gerontological Nurse Practitioner [facility]   04/05/2020 9:08 AM   Disclaimer: This note was dictated with voice recognition software. Similar sounding words can inadvertently be transcribed and may not be corrected upon review.

## 2020-04-24 ENCOUNTER — Telehealth: Payer: Self-pay | Admitting: *Deleted

## 2020-04-24 NOTE — Telephone Encounter (Signed)
LMOVM to call back to schedule TCS with Dr. Gala Romney, propofol, ASA 3

## 2020-04-25 ENCOUNTER — Encounter: Payer: Self-pay | Admitting: *Deleted

## 2020-04-25 MED ORDER — PEG 3350-KCL-NA BICARB-NACL 420 G PO SOLR: ORAL | 0 refills | Status: DC

## 2020-04-25 NOTE — Telephone Encounter (Signed)
LMOVM to call back 

## 2020-04-25 NOTE — Telephone Encounter (Signed)
Patient returned call. She has been scheduled for 6/30 at 10:30am. Aware will mail prep instructions with pre-op/covid test appt. Confirmed pharmacy/address.   PA approved via [employer] for TCS. Auth# [id number]  Jul 06 2020 - Aug 05 2020

## 2020-06-29 NOTE — Patient Instructions (Signed)
Bethany Myers  06/29/2020     @PREFPERIOPPHARMACY @   Your procedure is scheduled on  07/06/2020.   Report to [facility] at  0830 A.M.   Call this number if you have problems the morning of surgery:  [phone]   Remember:  Follow the diet and prep instructions given to you by the office.    Take these medicines the morning of surgery with A SIP OF WATER    amlodipine, flexeril, prozac, gabapentin, levothyroxine.     Please brush your teeth.  Do not wear jewelry, make-up or nail polish.  Do not wear lotions, powders, or perfumes, or deodorant.  Do not shave 48 hours prior to surgery.  Men may shave face and neck.  Do not bring valuables to the hospital.  [facility] is not responsible for any belongings or valuables.  Contacts, dentures or bridgework may not be worn into surgery.  Leave your suitcase in the car.  After surgery it may be brought to your room.  For patients admitted to the hospital, discharge time will be determined by your treatment team.  Patients discharged the day of surgery will not be allowed to drive home and must  have someone with them for 24 hours.      Special instructions:  DO NOT smoke tobacco or vape for 24 hours before your procedure.  Please read over the following fact sheets that you were given. Anesthesia Post-op Instructions and Care and Recovery After Surgery      Colonoscopy, Adult, Care After This sheet gives you information about how to care for yourself after your procedure. Your health care provider may also give you more specific instructions. If you have problems or questions, contact your health careprovider. What can I expect after the procedure? After the procedure, it is common to have: A small amount of blood in your stool for 24 hours after the procedure. Some gas. Mild cramping or bloating of your abdomen. Follow these instructions at home: Eating and drinking  Drink enough fluid to keep your urine pale  yellow. Follow instructions from your health care provider about eating or drinking restrictions. Resume your normal diet as instructed by your health care provider. Avoid heavy or fried foods that are hard to digest.  Activity Rest as told by your health care provider. Avoid sitting for a long time without moving. Get up to take short walks every 1-2 hours. This is important to improve blood flow and breathing. Ask for help if you feel weak or unsteady. Return to your normal activities as told by your health care provider. Ask your health care provider what activities are safe for you. Managing cramping and bloating  Try walking around when you have cramps or feel bloated. Apply heat to your abdomen as told by your health care provider. Use the heat source that your health care provider recommends, such as a moist heat pack or a heating pad. Place a towel between your skin and the heat source. Leave the heat on for 20-30 minutes. Remove the heat if your skin turns bright red. This is especially important if you are unable to feel pain, heat, or cold. You may have a greater risk of getting burned.  General instructions If you were given a sedative during the procedure, it can affect you for several hours. Do not drive or operate machinery until your health care provider says that it is safe. For the first 24 hours after the  procedure: Do not sign important documents. Do not drink alcohol. Do your regular daily activities at a slower pace than normal. Eat soft foods that are easy to digest. Take over-the-counter and prescription medicines only as told by your health care provider. Keep all follow-up visits as told by your health care provider. This is important. Contact a health care provider if: You have blood in your stool 2-3 days after the procedure. Get help right away if you have: More than a small spotting of blood in your stool. Large blood clots in your stool. Swelling of your  abdomen. Nausea or vomiting. A fever. Increasing pain in your abdomen that is not relieved with medicine. Summary After the procedure, it is common to have a small amount of blood in your stool. You may also have mild cramping and bloating of your abdomen. If you were given a sedative during the procedure, it can affect you for several hours. Do not drive or operate machinery until your health care provider says that it is safe. Get help right away if you have a lot of blood in your stool, nausea or vomiting, a fever, or increased pain in your abdomen. This information is not intended to replace advice given to you by your health care provider. Make sure you discuss any questions you have with your healthcare provider. Document Revised: 12/18/2018 Document Reviewed: 07/20/2018 Elsevier Patient Education  [place] After This sheet gives you information about how to care for yourself after your procedure. Your health care provider may also give you more specific instructions. If you have problems or questions, contact your health careprovider. What can I expect after the procedure? After the procedure, it is common to have: Tiredness. Forgetfulness about what happened after the procedure. Impaired judgment for important decisions. Nausea or vomiting. Some difficulty with balance. Follow these instructions at home: For the time period you were told by your health care provider:     Rest as needed. Do not participate in activities where you could fall or become injured. Do not drive or use machinery. Do not drink alcohol. Do not take sleeping pills or medicines that cause drowsiness. Do not make important decisions or sign legal documents. Do not take care of children on your own. Eating and drinking Follow the diet that is recommended by your health care provider. Drink enough fluid to keep your urine pale yellow. If you vomit: Drink water,  juice, or soup when you can drink without vomiting. Make sure you have little or no nausea before eating solid foods. General instructions Have a responsible adult stay with you for the time you are told. It is important to have someone help care for you until you are awake and alert. Take over-the-counter and prescription medicines only as told by your health care provider. If you have sleep apnea, surgery and certain medicines can increase your risk for breathing problems. Follow instructions from your health care provider about wearing your sleep device: Anytime you are sleeping, including during daytime naps. While taking prescription pain medicines, sleeping medicines, or medicines that make you drowsy. Avoid smoking. Keep all follow-up visits as told by your health care provider. This is important. Contact a health care provider if: You keep feeling nauseous or you keep vomiting. You feel light-headed. You are still sleepy or having trouble with balance after 24 hours. You develop a rash. You have a fever. You have redness or swelling around the IV site. Get help right  away if: You have trouble breathing. You have new-onset confusion at home. Summary For several hours after your procedure, you may feel tired. You may also be forgetful and have poor judgment. Have a responsible adult stay with you for the time you are told. It is important to have someone help care for you until you are awake and alert. Rest as told. Do not drive or operate machinery. Do not drink alcohol or take sleeping pills. Get help right away if you have trouble breathing, or if you suddenly become confused. This information is not intended to replace advice given to you by your health care provider. Make sure you discuss any questions you have with your healthcare provider. Document Revised: 09/09/2019 Document Reviewed: 11/26/2018 Elsevier Patient Education  2022 [employer].

## 2020-07-04 ENCOUNTER — Encounter (HOSPITAL_COMMUNITY): Payer: Self-pay

## 2020-07-04 ENCOUNTER — Other Ambulatory Visit (HOSPITAL_COMMUNITY): Payer: Medicare HMO | Attending: Internal Medicine

## 2020-07-04 ENCOUNTER — Encounter (HOSPITAL_COMMUNITY)
Admission: RE | Admit: 2020-07-04 | Discharge: 2020-07-04 | Disposition: A | Discharge status: Home / Self Care | Payer: Medicare HMO | Source: Ambulatory Visit | Attending: Internal Medicine | Admitting: Internal Medicine

## 2020-07-04 ENCOUNTER — Other Ambulatory Visit: Payer: Self-pay

## 2020-07-04 DIAGNOSIS — Z01812 Encounter for preprocedural laboratory examination: Secondary | ICD-10-CM | POA: Insufficient documentation

## 2020-07-04 HISTORY — DX: Hypothyroidism, unspecified: E03.9

## 2020-07-04 LAB — CBC WITH DIFFERENTIAL/PLATELET
Abs Immature Granulocytes: 0.04 10*3/uL (ref 0.00–0.07)
Basophils Absolute: 0.1 10*3/uL (ref 0.0–0.1)
Basophils Relative: 1 %
Eosinophils Absolute: 0.5 10*3/uL (ref 0.0–0.5)
Eosinophils Relative: 7 %
HCT: 41.1 % (ref 36.0–46.0)
Hemoglobin: 13.4 g/dL (ref 12.0–15.0)
Immature Granulocytes: 1 %
Lymphocytes Relative: 24 %
Lymphs Abs: 1.8 10*3/uL (ref 0.7–4.0)
MCH: 28.6 pg (ref 26.0–34.0)
MCHC: 32.6 g/dL (ref 30.0–36.0)
MCV: 87.8 fL (ref 80.0–100.0)
Monocytes Absolute: 0.4 10*3/uL (ref 0.1–1.0)
Monocytes Relative: 6 %
Neutro Abs: 4.5 10*3/uL (ref 1.7–7.7)
Neutrophils Relative %: 61 %
Platelets: 247 10*3/uL (ref 150–400)
RBC: 4.68 MIL/uL (ref 3.87–5.11)
RDW: 13.9 % (ref 11.5–15.5)
WBC: 7.2 10*3/uL (ref 4.0–10.5)
nRBC: 0 % (ref 0.0–0.2)

## 2020-07-04 LAB — BASIC METABOLIC PANEL
Anion gap: 11 (ref 5–15)
BUN: 19 mg/dL (ref 8–23)
CO2: 26 mmol/L (ref 22–32)
Calcium: 9.1 mg/dL (ref 8.9–10.3)
Chloride: 98 mmol/L (ref 98–111)
Creatinine, Ser: 0.8 mg/dL (ref 0.44–1.00)
GFR, Estimated: >60 mL/min (ref >60)
Glucose, Bld: 173 mg/dL — ABNORMAL HIGH (ref 70–99)
Potassium: 3.1 mmol/L — ABNORMAL LOW (ref 3.5–5.1)
Sodium: 135 mmol/L (ref 135–145)

## 2020-07-06 ENCOUNTER — Ambulatory Visit (HOSPITAL_COMMUNITY): Payer: Medicare HMO | Admitting: Anesthesiology

## 2020-07-06 ENCOUNTER — Other Ambulatory Visit: Payer: Self-pay

## 2020-07-06 ENCOUNTER — Encounter (HOSPITAL_COMMUNITY)
Admission: RE | Disposition: A | Discharge status: Home / Self Care | Payer: Self-pay | Source: Home / Self Care | Attending: Internal Medicine

## 2020-07-06 ENCOUNTER — Ambulatory Visit (HOSPITAL_COMMUNITY)
Admission: RE | Admit: 2020-07-06 | Discharge: 2020-07-06 | Disposition: A | Discharge status: Home / Self Care | Payer: Medicare HMO | Attending: Internal Medicine | Admitting: Internal Medicine

## 2020-07-06 ENCOUNTER — Encounter (HOSPITAL_COMMUNITY): Payer: Self-pay | Admitting: Internal Medicine

## 2020-07-06 DIAGNOSIS — Z8042 Family history of malignant neoplasm of prostate: Secondary | ICD-10-CM | POA: Insufficient documentation

## 2020-07-06 DIAGNOSIS — Z803 Family history of malignant neoplasm of breast: Secondary | ICD-10-CM | POA: Insufficient documentation

## 2020-07-06 DIAGNOSIS — Z8249 Family history of ischemic heart disease and other diseases of the circulatory system: Secondary | ICD-10-CM | POA: Insufficient documentation

## 2020-07-06 DIAGNOSIS — Z79890 Hormone replacement therapy (postmenopausal): Secondary | ICD-10-CM | POA: Insufficient documentation

## 2020-07-06 DIAGNOSIS — K5730 Diverticulosis of large intestine without perforation or abscess without bleeding: Secondary | ICD-10-CM | POA: Insufficient documentation

## 2020-07-06 DIAGNOSIS — Z7951 Long term (current) use of inhaled steroids: Secondary | ICD-10-CM | POA: Insufficient documentation

## 2020-07-06 DIAGNOSIS — D127 Benign neoplasm of rectosigmoid junction: Secondary | ICD-10-CM | POA: Insufficient documentation

## 2020-07-06 DIAGNOSIS — E039 Hypothyroidism, unspecified: Secondary | ICD-10-CM | POA: Insufficient documentation

## 2020-07-06 DIAGNOSIS — Z79899 Other long term (current) drug therapy: Secondary | ICD-10-CM | POA: Diagnosis not present

## 2020-07-06 DIAGNOSIS — Z7984 Long term (current) use of oral hypoglycemic drugs: Secondary | ICD-10-CM | POA: Diagnosis not present

## 2020-07-06 DIAGNOSIS — K635 Polyp of colon: Secondary | ICD-10-CM

## 2020-07-06 DIAGNOSIS — D124 Benign neoplasm of descending colon: Secondary | ICD-10-CM | POA: Insufficient documentation

## 2020-07-06 DIAGNOSIS — Z888 Allergy status to other drugs, medicaments and biological substances status: Secondary | ICD-10-CM | POA: Diagnosis not present

## 2020-07-06 DIAGNOSIS — E119 Type 2 diabetes mellitus without complications: Secondary | ICD-10-CM | POA: Insufficient documentation

## 2020-07-06 DIAGNOSIS — D122 Benign neoplasm of ascending colon: Secondary | ICD-10-CM | POA: Diagnosis not present

## 2020-07-06 DIAGNOSIS — K921 Melena: Secondary | ICD-10-CM | POA: Diagnosis present

## 2020-07-06 DIAGNOSIS — Z800 Family history of malignant neoplasm of digestive organs: Secondary | ICD-10-CM | POA: Diagnosis not present

## 2020-07-06 DIAGNOSIS — Z8379 Family history of other diseases of the digestive system: Secondary | ICD-10-CM | POA: Insufficient documentation

## 2020-07-06 DIAGNOSIS — E876 Hypokalemia: Secondary | ICD-10-CM

## 2020-07-06 DIAGNOSIS — K219 Gastro-esophageal reflux disease without esophagitis: Secondary | ICD-10-CM | POA: Insufficient documentation

## 2020-07-06 HISTORY — PX: POLYPECTOMY: SHX5525

## 2020-07-06 HISTORY — PX: COLONOSCOPY WITH PROPOFOL: SHX5780

## 2020-07-06 SURGERY — COLONOSCOPY WITH PROPOFOL
Anesthesia: General

## 2020-07-06 MED ORDER — PROPOFOL 500 MG/50ML IV EMUL
INTRAVENOUS | Status: DC | PRN
  Administered 2020-07-06: 150 ug/kg/min via INTRAVENOUS

## 2020-07-06 MED ORDER — PROPOFOL 10 MG/ML IV BOLUS
INTRAVENOUS | Status: AC
  Filled 2020-07-06: qty 60

## 2020-07-06 MED ORDER — LACTATED RINGERS IV SOLN: INTRAVENOUS | Status: DC | PRN

## 2020-07-06 MED ORDER — SPOT INK MARKER SYRINGE KIT
PACK | SUBMUCOSAL | Status: DC | PRN
  Administered 2020-07-06: 1 mL via SUBMUCOSAL

## 2020-07-06 MED ORDER — POTASSIUM CHLORIDE CRYS ER 20 MEQ PO TBCR: 20.0000 meq | EXTENDED_RELEASE_TABLET | Freq: Two times a day (BID) | ORAL | 0 refills | Status: AC

## 2020-07-06 MED ORDER — PROPOFOL 10 MG/ML IV BOLUS
INTRAVENOUS | Status: DC | PRN
  Administered 2020-07-06: 20 mg via INTRAVENOUS
  Administered 2020-07-06: 50 mg via INTRAVENOUS
  Administered 2020-07-06 (×2): 20 mg via INTRAVENOUS

## 2020-07-06 NOTE — Discharge Instructions (Addendum)
Colonoscopy Discharge Instructions  Read the instructions outlined below and refer to this sheet in the next few weeks. These discharge instructions provide you with general information on caring for yourself after you leave the hospital. Your doctor may also give you specific instructions. While your treatment has been planned according to the most current medical practices available, unavoidable complications occasionally occur. If you have any problems or questions after discharge, call Dr. Gala Romney at [phone]. ACTIVITY You may resume your regular activity, but move at a slower pace for the next 24 hours.  Take frequent rest periods for the next 24 hours.  Walking will help get rid of the air and reduce the bloated feeling in your belly (abdomen).  No driving for 24 hours (because of the medicine (anesthesia) used during the test).   Do not sign any important legal documents or operate any machinery for 24 hours (because of the anesthesia used during the test).  NUTRITION Drink plenty of fluids.  You may resume your normal diet as instructed by your doctor.  Begin with a light meal and progress to your normal diet. Heavy or fried foods are harder to digest and may make you feel sick to your stomach (nauseated).  Avoid alcoholic beverages for 24 hours or as instructed.  MEDICATIONS You may resume your normal medications unless your doctor tells you otherwise.  WHAT YOU CAN EXPECT TODAY Some feelings of bloating in the abdomen.  Passage of more gas than usual.  Spotting of blood in your stool or on the toilet paper.  IF YOU HAD POLYPS REMOVED DURING THE COLONOSCOPY: No aspirin products for 7 days or as instructed.  No alcohol for 7 days or as instructed.  Eat a soft diet for the next 24 hours.  FINDING OUT THE RESULTS OF YOUR TEST Not all test results are available during your visit. If your test results are not back during the visit, make an appointment with your caregiver to find out the  results. Do not assume everything is normal if you have not heard from your caregiver or the medical facility. It is important for you to follow up on all of your test results.  SEEK IMMEDIATE MEDICAL ATTENTION IF: You have more than a spotting of blood in your stool.  Your belly is swollen (abdominal distention).  You are nauseated or vomiting.  You have a temperature over 101.  You have abdominal pain or discomfort that is severe or gets worse throughout the day.     Multiple polyps removed today  Polyp and diverticulosis information provided  At patient request, I called Arrie Aran at [phone]-recording said it was out of service  Further recommendations to follow pending review of pathology report  PATIENT INSTRUCTIONS POST-ANESTHESIA  IMMEDIATELY FOLLOWING SURGERY:  Do not drive or operate machinery for the first twenty four hours after surgery.  Do not make any important decisions for twenty four hours after surgery or while taking narcotic pain medications or sedatives.  If you develop intractable nausea and vomiting or a severe headache please notify your doctor immediately.  FOLLOW-UP:  Please make an appointment with your surgeon as instructed. You do not need to follow up with anesthesia unless specifically instructed to do so.  WOUND CARE INSTRUCTIONS (if applicable):  Keep a dry clean dressing on the anesthesia/puncture wound site if there is drainage.  Once the wound has quit draining you may leave it open to air.  Generally you should leave the bandage intact for twenty four  hours unless there is drainage.  If the epidural site drains for more than 36-48 hours please call the anesthesia department.  QUESTIONS?:  Please feel free to call your physician or the hospital operator if you have any questions, and they will be happy to assist you.

## 2020-07-06 NOTE — Anesthesia Preprocedure Evaluation (Signed)
Anesthesia Evaluation  Patient identified by MRN, date of birth, ID band Patient awake    Reviewed: Allergy & Precautions, H&P , NPO status , Patient's Chart, lab work & pertinent test results, reviewed documented beta blocker date and time   Airway Mallampati: II  TM Distance: >3 FB Neck ROM: full    Dental no notable dental hx.    Pulmonary pneumonia, COPD,    Pulmonary exam normal breath sounds clear to auscultation       Cardiovascular Exercise Tolerance: Good hypertension, negative cardio ROS   Rhythm:regular Rate:Normal     Neuro/Psych PSYCHIATRIC DISORDERS Depression negative neurological ROS     GI/Hepatic Neg liver ROS, GERD  Medicated,  Endo/Other  diabetesHypothyroidism Morbid obesity  Renal/GU negative Renal ROS  negative genitourinary   Musculoskeletal   Abdominal   Peds  Hematology negative hematology ROS (+)   Anesthesia Other Findings   Reproductive/Obstetrics negative OB ROS                             Anesthesia Physical Anesthesia Plan  ASA: 3  Anesthesia Plan: General   Post-op Pain Management:    Induction:   PONV Risk Score and Plan:   Airway Management Planned:   Additional Equipment:   Intra-op Plan:   Post-operative Plan:   Informed Consent: I have reviewed the patients History and Physical, chart, labs and discussed the procedure including the risks, benefits and alternatives for the proposed anesthesia with the patient or authorized representative who has indicated his/her understanding and acceptance.     Dental Advisory Given  Plan Discussed with: CRNA  Anesthesia Plan Comments:         Anesthesia Quick Evaluation

## 2020-07-06 NOTE — Anesthesia Postprocedure Evaluation (Signed)
Anesthesia Post Note  Patient: Bethany Myers  Procedure(s) Performed: COLONOSCOPY WITH PROPOFOL POLYPECTOMY  Patient location during evaluation: Short Stay Anesthesia Type: General Level of consciousness: awake and alert Pain management: pain level controlled Vital Signs Assessment: post-procedure vital signs reviewed and stable Respiratory status: spontaneous breathing Cardiovascular status: blood pressure returned to baseline and stable Postop Assessment: no apparent nausea or vomiting Anesthetic complications: no   No notable events documented.   Last Vitals:  Vitals:   07/06/20 0901 07/06/20 1109  BP: (!) 155/89 120/67  Pulse: 94 87  Resp: 18 19  Temp: 36.8 C 36.7 C  SpO2: 98% 97%    Last Pain:  Vitals:   07/06/20 1109  TempSrc: Oral  PainSc: 0-No pain                 Landi Biscardi

## 2020-07-06 NOTE — H&P (Signed)
@LOGO @   Primary Care Physician:  Zhou-Talbert, Elwyn Lade, MD Primary Gastroenterologist:  Dr. Gala Romney  Pre-Procedure History & Physical: HPI:  Bethany Myers is a 70 y.o. female here for diagnostic colonoscopy.  History of rectal bleeding, Hemoccult positive stool and a positive Cologuard done elsewhere.  No prior colonoscopy.  Prior colonoscopy scheduled with patient canceled.  Past Medical History:  Diagnosis Date   Acid reflux disease    COPD (chronic obstructive pulmonary disease) ([place]) 03/2020   Depression    Diabetes mellitus without complication ([place])    Goiter    Hypertension    Hypothyroidism    Pneumonia 03/2020    Past Surgical History:  Procedure Laterality Date   ABDOMINAL HYSTERECTOMY     APPENDECTOMY     BACK SURGERY     cataracts     CHOLECYSTECTOMY N/A 01/26/2020   Procedure: LAPAROSCOPIC CHOLECYSTECTOMY;  Surgeon: Virl Cagey, MD;  Location: [facility];  Service: General;  Laterality: N/A;   LAPAROSCOPIC BILATERAL SALPINGO OOPHERECTOMY  01/26/2020   Procedure: LAPAROSCOPIC BILATERAL SALPINGO OOPHORECTOMY;  Surgeon: Florian Buff, MD;  Location: [facility];  Service: Gynecology;;   TUBAL LIGATION      Prior to Admission medications   Medication Sig Start Date End Date Taking? Authorizing Provider  amLODipine (NORVASC) 5 MG tablet Take 5 mg by mouth daily.  02/21/17  Yes [provider]  budesonide-formoterol (SYMBICORT) 80-4.5 MCG/ACT inhaler Inhale 2 puffs into the lungs 2 (two) times daily as needed (asthma).   Yes [provider]  Cholecalciferol (VITAMIN D3) 125 MCG (5000 UT) CAPS Take 5,000 Units by mouth daily.   Yes [provider]  FLUoxetine (PROZAC) 20 MG capsule Take 20 mg by mouth daily. 05/26/18  Yes [provider]  FLUoxetine (PROZAC) 40 MG capsule Take 40 mg by mouth daily.  02/04/17  Yes [provider]  fluticasone (FLONASE) 50 MCG/ACT nasal spray Place 2 sprays into both nostrils daily as needed for  allergies.   Yes [provider]  Fluticasone-Umeclidin-Vilant (TRELEGY ELLIPTA) 100-62.5-25 MCG/INH AEPB Inhale 1 puff into the lungs daily.   Yes [provider]  hydrochlorothiazide (HYDRODIURIL) 25 MG tablet Take 25 mg by mouth daily.  02/21/17  Yes [provider]  ibuprofen (ADVIL) 200 MG tablet Take 800 mg by mouth every 8 (eight) hours as needed for moderate pain.   Yes [provider]  levothyroxine (SYNTHROID, LEVOTHROID) 50 MCG tablet TAKE 1 TABLET BY MOUTH IN THE MORNING. Patient taking differently: Take 50 mcg by mouth daily before breakfast. 10/27/14  Yes Nida, Marella Chimes, MD  metFORMIN (GLUCOPHAGE) 500 MG tablet Take 500 mg by mouth every morning.   Yes [provider]  potassium chloride SA (K-DUR) 20 MEQ tablet Take 20 mEq by mouth 2 (two) times daily. 03/16/18  Yes [provider]  pravastatin (PRAVACHOL) 20 MG tablet Take 20 mg by mouth daily.   Yes [provider]  polyethylene glycol-electrolytes (NULYTELY) 420 g solution As directed 04/25/20   Ottie Tillery, Cristopher Estimable, MD  potassium chloride SA (KLOR-CON) 20 MEQ tablet Take 1 tablet (20 mEq total) by mouth 2 (two) times daily for 5 days. 07/06/20 07/11/20  Daneil Dolin, MD    Allergies as of 04/25/2020 - Review Complete 04/05/2020  Allergen Reaction Noted   Trimethoprim Swelling, Rash, and Other (See Comments) 05/07/2017    Family History  Problem Relation Age of Onset   Constipation Mother        died at  age 68, "blockage", ended up with colostomy but no cancer   Breast cancer Mother    Suicidality Daughter        Died Jan 12, 2017   Cancer Maternal Grandfather    Throat cancer Maternal Grandfather    Cancer Father    Prostate cancer Father    Liver cancer Father    Heart attack Brother    Stroke Brother    Prostate cancer Brother    Colon cancer Neg Hx     Social History   Socioeconomic History   Marital status: Married    Spouse name: Not on file    Number of children: Not on file   Years of education: Not on file   Highest education level: Not on file  Occupational History   Not on file  Tobacco Use   Smoking status: Never   Smokeless tobacco: Never  Vaping Use   Vaping Use: Never used  Substance and Sexual Activity   Alcohol use: Never   Drug use: Never   Sexual activity: Not Currently    Birth control/protection: Surgical  Other Topics Concern   Not on file  Social History Narrative   Not on file   Social Determinants of Health   Financial Resource Strain: Low Risk    Difficulty of Paying Living Expenses: Not very hard  Food Insecurity: No Food Insecurity   Worried About Running Out of Food in the Last Year: Never true   [place] in the Last Year: Never true  Transportation Needs: No Transportation Needs   Lack of Transportation (Medical): No   Lack of Transportation (Non-Medical): No  Physical Activity: Unknown   Days of Exercise per Week: Patient refused   Minutes of Exercise per Session: Patient refused  Stress: Stress Concern Present   Feeling of Stress : Very much  Social Connections: Unknown   Frequency of Communication with Friends and Family: More than three times a week   Frequency of Social Gatherings with Friends and Family: More than three times a week   Attends Religious Services: Patient refused   Marine scientist or Organizations: No   Attends Music therapist: Never   Marital Status: Married  Human resources officer Violence: Not At Risk   Fear of Current or Ex-Partner: No   Emotionally Abused: No   Physically Abused: No   Sexually Abused: No    Review of Systems: See HPI, otherwise negative ROS  Physical Exam: BP (!) 155/89   Pulse 94   Temp 98.2 F (36.8 C) (Oral)   Resp 18   SpO2 98%  General:   Alert,  Well-developed, well-nourished, pleasant and cooperative in NAD Mouth:  No deformity or lesions. Neck:  Supple; no masses or thyromegaly. No significant  cervical adenopathy. Lungs:  Clear throughout to auscultation.   No wheezes, crackles, or rhonchi. No acute distress. Heart:  Regular rate and rhythm; no murmurs, clicks, rubs,  or gallops. Abdomen: Non-distended, normal bowel sounds.  Soft and nontender without appreciable mass or hepatosplenomegaly.  Pulses:  Normal pulses noted. Extremities:  Without clubbing or edema.  Impression/Plan: 70 year old lady with rectal bleeding Hemoccult positive stool/positive Cologuard.  Here for first-ever colonoscopy. The risks, benefits, limitations, alternatives and imponderables have been reviewed with the patient. Questions have been answered. All parties are agreeable.      Notice: This dictation was prepared with Dragon dictation along with smaller phrase technology. Any transcriptional errors that result from this process are unintentional and  may not be corrected upon review.

## 2020-07-06 NOTE — Transfer of Care (Signed)
Immediate Anesthesia Transfer of Care Note  Patient: Bethany Myers  Procedure(s) Performed: COLONOSCOPY WITH PROPOFOL POLYPECTOMY  Patient Location: Short Stay  Anesthesia Type:General  Level of Consciousness: awake  Airway & Oxygen Therapy: Patient Spontanous Breathing  Post-op Assessment: Report given to RN  Post vital signs: Reviewed and stable  Last Vitals:  Vitals Value Taken Time  BP    Temp    Pulse    Resp    SpO2      Last Pain:  Vitals:   07/06/20 1035  TempSrc:   PainSc: 0-No pain         Complications: No notable events documented.

## 2020-07-06 NOTE — Op Note (Addendum)
[facility] Patient Name: Bethany Myers Procedure Date: 07/06/2020 10:19 AM MRN: [id number] Date of Birth: 05-Nov-1950 Attending MD: Norvel Richards , MD CSN: [id number] Age: 70 Admit Type: Outpatient Procedure:                Colonoscopy Indications:              Heme positive stool Providers:                Norvel Richards, MD, Gwenlyn Fudge, RN, Randa Spike, Technician Referring MD:              Medicines:                Propofol per Anesthesia Complications:            No immediate complications. Estimated Blood Loss:     Estimated blood loss was minimal. Procedure:                Pre-Anesthesia Assessment:                           - Prior to the procedure, a History and Physical                            was performed, and patient medications and                            allergies were reviewed. The patient's tolerance of                            previous anesthesia was also reviewed. The risks                            and benefits of the procedure and the sedation                            options and risks were discussed with the patient.                            All questions were answered, and informed consent                            was obtained. Prior Anticoagulants: The patient has                            taken no previous anticoagulant or antiplatelet                            agents. ASA Grade Assessment: III - A patient with                            severe systemic disease. After reviewing the risks  and benefits, the patient was deemed in                            satisfactory condition to undergo the procedure.                           After obtaining informed consent, the colonoscope                            was passed under direct vision. Throughout the                            procedure, the patient's blood pressure, pulse, and                            oxygen  saturations were monitored continuously. The                            CF-HQ190L ([id number]) scope was introduced through                            the anus and advanced to the the cecum, identified                            by appendiceal orifice and ileocecal valve. The                            entire colon was well visualized. Scope In: 10:37:41 AM Scope Out: 11:02:37 AM Scope Withdrawal Time: 0 hours 19 minutes 22 seconds  Total Procedure Duration: 0 hours 24 minutes 56 seconds  Findings:      The perianal and digital rectal examinations were normal.      A 20 mm polyp was found in the recto-sigmoid colon. The polyp was       pedunculated. The polyp was removed with a hot snare. Resection and       retrieval were complete. Resected and retrieved. 1 cc of ink placed 1 cm       distal to polypectomy site Estimated blood loss: none.      Three sessile polyps were found in the descending colon and ascending       colon. The polyps were 3 to 6 mm in size. These polyps were removed with       a cold snare. Resection and retrieval were complete. Estimated blood       loss was minimal.      Scattered medium-mouthed diverticula were found in the sigmoid colon and       descending colon. I cm buldge at base of appendix with some deformity of       orifice - of uncertain significance.      The exam was otherwise without abnormality on direct and retroflexion       views. Impression:               - One 20 mm polyp at the recto-sigmoid colon,                            removed  with a hot snare. Status post inking.                           - Three 3 to 6 mm polyps in the descending colon                            and in the ascending colon, removed with a cold                            snare. Resected and retrieved. Appendix bulging                            into cecal lumen. Would not reduce. Please see                            photos.- Diverticulosis in the sigmoid colon and in                             the descending colon. Abnormal appendix of                            uncertain significance                           - The examination was otherwise normal on direct                            and retroflexion views. Moderate Sedation:      Moderate (conscious) sedation was personally administered by an       anesthesia professional. The following parameters were monitored: oxygen       saturation, heart rate, blood pressure, respiratory rate, EKG, adequacy       of pulmonary ventilation, and response to care. Recommendation:           - Patient has a contact number available for                            emergencies. The signs and symptoms of potential                            delayed complications were discussed with the                            patient. Return to normal activities tomorrow.                            Written discharge instructions were provided to the                            patient.                           - Resume previous diet.                           -  Continue present medications.                           - Repeat colonoscopy date to be determined after                            pending pathology results are reviewed for                            surveillance.                           - Return to GI office (date not yet determined). CT                            of the abdomen pelvis to further evaluate abnormal                            appearing appendix. Procedure Code(s):        --- Professional ---                           [phone], Colonoscopy, flexible; with removal of                            tumor(s), polyp(s), or other lesion(s) by snare                            technique Diagnosis Code(s):        --- Professional ---                           K63.5, Polyp of colon                           R19.5, Other fecal abnormalities                           K57.30, Diverticulosis of large intestine without                             perforation or abscess without bleeding CPT copyright 2019 American Medical Association. All rights reserved. The codes documented in this report are preliminary and upon coder review may  be revised to meet current compliance requirements. Cristopher Estimable. Annleigh Knueppel, MD Norvel Richards, MD 07/06/2020 11:13:07 AM This report has been signed electronically. Number of Addenda: 0

## 2020-07-07 LAB — SURGICAL PATHOLOGY

## 2020-07-07 LAB — GLUCOSE, CAPILLARY: Glucose-Capillary: 176 mg/dL — ABNORMAL HIGH (ref 70–99)

## 2020-07-08 ENCOUNTER — Encounter: Payer: Self-pay | Admitting: Internal Medicine

## 2020-07-11 ENCOUNTER — Telehealth: Payer: Self-pay | Admitting: Internal Medicine

## 2020-07-11 ENCOUNTER — Telehealth: Payer: Self-pay

## 2020-07-11 ENCOUNTER — Other Ambulatory Visit: Payer: Self-pay

## 2020-07-11 DIAGNOSIS — K389 Disease of appendix, unspecified: Secondary | ICD-10-CM

## 2020-07-11 NOTE — Progress Notes (Signed)
Order: [id number] Status: Edited Result - FINAL   Visible to patient: No (inaccessible in MyChart)   Next appt: None   0 Result Notes   1 Follow-up Encounter  Component 5 d ago   SURGICAL PATHOLOGY SURGICAL PATHOLOGY  CASE: [id number]  PATIENT: Bethany Myers  Surgical Pathology Report      Clinical History: positive Cologuard      FINAL MICROSCOPIC DIAGNOSIS:   A. COLON, ASCENDING, POLYPECTOMY:  - Tubular adenoma, 1 fragment.  No high-grade dysplasia or malignancy.  - Sessile serrated adenoma without cytologic dysplasia, 1 fragment.  No  malignancy.   B. COLON, DESCENDING, POLYPECTOMY:  - Tubular adenoma, 1 fragment.  No high-grade dysplasia or malignancy.   C. RECTOSIGMOID, POLYPECTOMY:  - Tubulovillous adenomas, 2.  No high-grade dysplasia or malignancy.   GROSS DESCRIPTION:   A: Received in formalin are tan, soft tissue fragments that are  submitted in toto. Number: 2 size: 0.7 and 1.1 cm blocks: 1   B: Received in formalin is a tan, soft tissue fragment that is submitted  in toto.  Size: 0.5 cm, 1 block submitted.   C: Received in formalin are 2 tan-red mucosal polyps which measure 1 x  0.8 x 0.7 cm and 1.5 x 1.5 x 1.3 cm.  The specimen is inked, sectioned  and entirely submitted in 2 cassettes.  [facility] 07/06/2020)    Final Diagnosis performed by Mark Martinique, MD.   Electronically signed  07/07/2020  Technical component performed at [facility], [place]  [address] [place], [place] [address].   Professional component performed at [employer]. [facility],  [place] [address], [place], [place] [address].   Immunohistochemistry Technical component (if applicable) was performed  at [facility]. [address], [place],  [place], [place] [address].   IMMUNOHISTOCHEMISTRY DISCLAIMER (if applicable):  Some of these immunohistochemical stains may have been developed and the  performance characteristics determine by [facility]. Some  may not have been cleared or approved by the U.S. Food and Drug  Administration. The FDA has determined that such clearance or approval  is not necessary. This test is used for clinical purposes. It should not  be regarded as investigational or for research. This laboratory is  certified under the [facility]  (CLIA-88) as qualified to perform high complexity clinical laboratory  testing.  The controls stained appropriately.   Resulting Agency [facility] PATH LAB         Specimen Collected: 07/06/20 10:41 Last Resulted: 07/07/20 10:31      Lab Flowsheet    Order Details    View Encounter    Lab and Collection Details    Routing    Result History    View Encounter Conversation         Linked Documents  View Image     Result Care Coordination    Patient Communication   Add Comments   Not seen Back to Top       Follow-up Encounters  07/08/2020 Letter (Out) Back to Top        Result Information  Status Priority Source  Edited Result - FINAL (07/07/2020 1031) Timed PATH GI biopsy    Authorizing Provider Information  Name: Daneil Dolin, MD Fax: [phone]  Phone: [phone] Pager:        Status of Other Orders  This encounter has no active orders. View All Orders From This Encounter    All Reviewers List  Daneil Dolin, MD on 07/08/2020 12:00     Surgical pathology: Patient Communication   Add Comments   Not seen    Cervical Cancer Screening - Results and Follow-ups Selected result Result date Tests and Procedures Follow-ups  07/06/2020  Surgical pathology   Cervical Cancer Screening History Report  View [facility] Info  Surgical pathology (Order #[id number]) on 07/06/20

## 2020-07-11 NOTE — Telephone Encounter (Signed)
Patient returned call from today, please call back

## 2020-07-11 NOTE — Telephone Encounter (Signed)
Pt mailed letter and her PCP and referring dr. Virgel Gess. LMOVM of the pt to return call

## 2020-07-11 NOTE — Telephone Encounter (Signed)
Routing to Dena so she is aware.

## 2020-07-11 NOTE — Telephone Encounter (Signed)
The pt returned our call, she was advised of her pathology report and that we would be calling her to set up a CT scan for the pt due to abnormal tissue (?) to make sure that there's nothing serious going on. The pt agreed to have a CT done.

## 2020-07-11 NOTE — Telephone Encounter (Signed)
error 

## 2020-07-12 NOTE — Addendum Note (Signed)
Addended by: Hassan Rowan on: 07/12/2020 07:55 AM   Modules accepted: Orders

## 2020-07-12 NOTE — Telephone Encounter (Signed)
CT abd/pelvis w/contrast scheduled for 08/02/20 at 4:00pm, arrive at 3:45pm. NPO 4 hours prior to test. Pick up contrast before day of test.  Tried to call pt, LMOVM to inform her CT was scheduled. Appt letter mailed.

## 2020-07-13 ENCOUNTER — Encounter (HOSPITAL_COMMUNITY): Payer: Self-pay | Admitting: Internal Medicine

## 2020-07-19 NOTE — Telephone Encounter (Signed)
PA for CT abd/pelvis submitted via [employer] website. Case went to clinical review. Tracking# [id number].

## 2020-07-25 NOTE — Telephone Encounter (Signed)
Received fax from [facility], they were unable to approve request at the clinical review level because the case does not meed clinical guidelines. Supporting clinical documentation and clinical questions answered. Returned fax to [employer].

## 2020-07-28 NOTE — Telephone Encounter (Signed)
CT approved. [employer]# [id number], valid 08/02/20-09/01/20.

## 2020-08-02 ENCOUNTER — Other Ambulatory Visit: Payer: Self-pay

## 2020-08-02 ENCOUNTER — Ambulatory Visit (HOSPITAL_COMMUNITY)
Admission: RE | Admit: 2020-08-02 | Discharge: 2020-08-02 | Disposition: A | Discharge status: Home / Self Care | Payer: Medicare HMO | Source: Ambulatory Visit | Attending: Internal Medicine | Admitting: Internal Medicine

## 2020-08-02 DIAGNOSIS — K389 Disease of appendix, unspecified: Secondary | ICD-10-CM

## 2020-08-02 MED ORDER — IOHEXOL 300 MG/ML  SOLN
100.0000 mL | Freq: Once | INTRAMUSCULAR | Status: AC | PRN
  Administered 2020-08-02: 100 mL via INTRAVENOUS

## 2021-02-23 IMAGING — MR MRI OF THE RIGHT FOREFOOT WITHOUT AND WITH CONTRAST
9 series · 40 of 40 positions shown · IV contrast (Gadavist)
Comparison: None

CLINICAL DATA: Right foot soft tissue mass at the bottom of the
foot between the big toe and second toe for 6 weeks. Mass removed 1
month ago but appears enlarged and painful.

EXAM:
MRI OF THE RIGHT FOREFOOT WITHOUT AND WITH CONTRAST
TECHNIQUE: Multiplanar, multisequence MR imaging of the right forefoot was
performed before and after the administration of intravenous
contrast.
CONTRAST:  10 mL Gadavist

[Series 4: t1_tse_tra_320 · coronal · right · 4.0mm · 0.44mm/px · 4 of 30 slices shown]
[im 1/30]
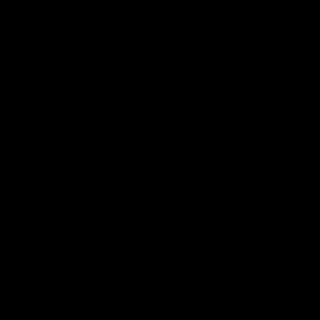
[im 10/30]
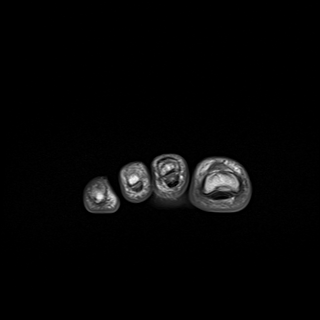
[im 20/30]
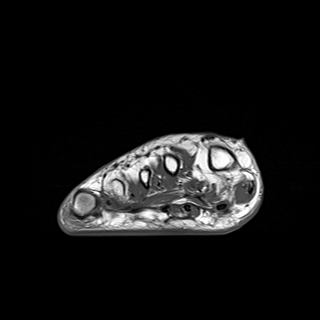
[im 30/30]
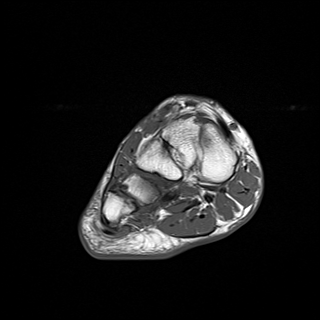

[Series 5: t2_tse_fs_tra_352 · coronal · right · 4.0mm · 0.40mm/px · 5 of 30 slices shown]
[im 1/30]
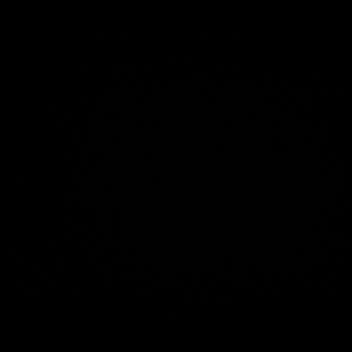
[im 8/30]
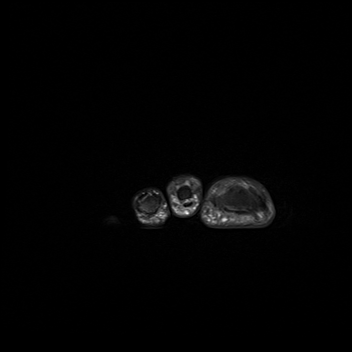
[im 15/30]
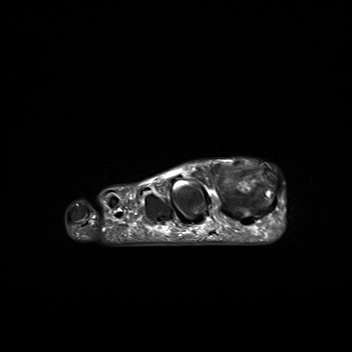
[im 22/30]
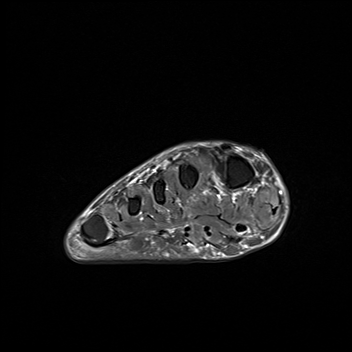
[im 30/30]
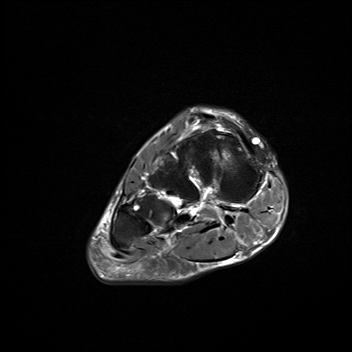

[Series 6: t1_tse_fs_tra_320 · coronal · right · 4.0mm · 0.44mm/px · 5 of 30 slices shown (1 of 2)]
[im 1/30]
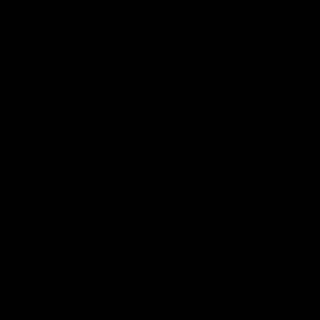
[im 8/30]
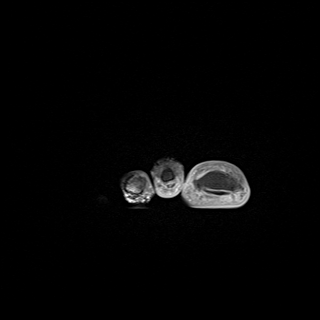
[im 15/30]
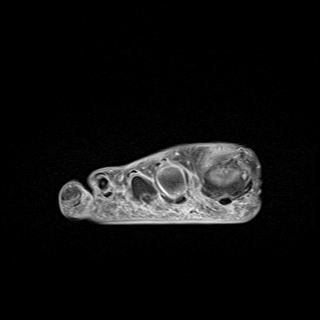
[im 22/30]
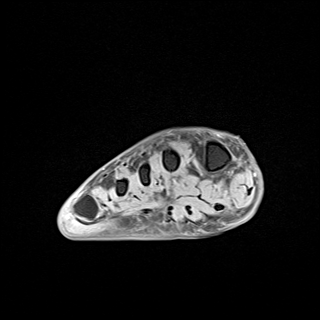
[im 30/30]
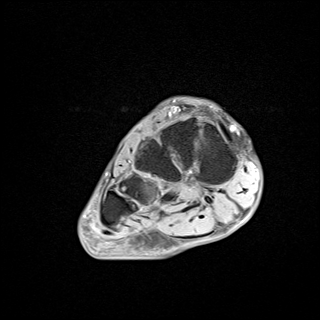

[Series 7: t1_tse_cor_304 · axial · right · 4.0mm · 0.46mm/px · z∈[-164,-22]mm · 5 of 30 slices shown]
[im 1/30]
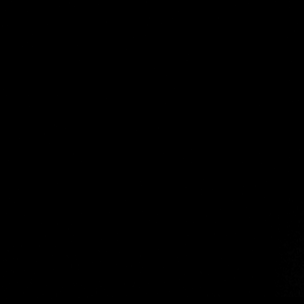
[im 8/30]
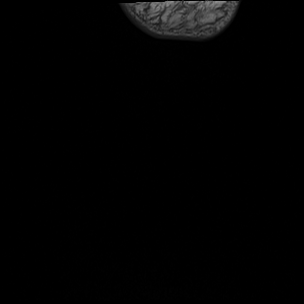
[im 15/30]
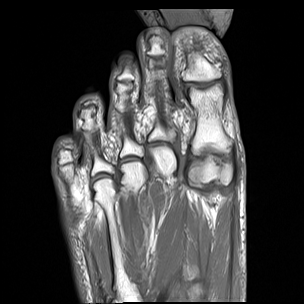
[im 22/30]
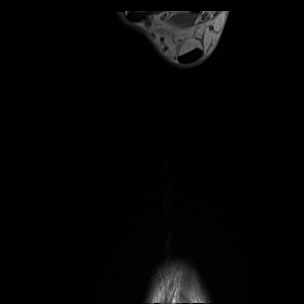
[im 30/30]
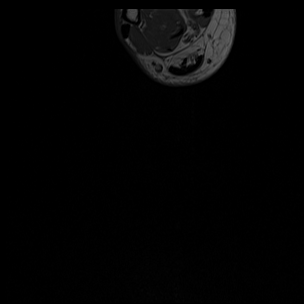

[Series 8: t2_tse_fs_cor_304 · axial · right · 4.0mm · 0.46mm/px · z∈[-164,-22]mm · 5 of 30 slices shown]
[im 1/30]
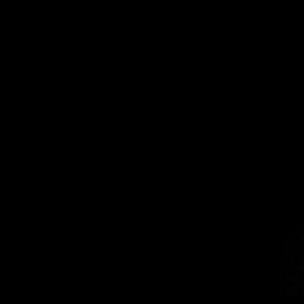
[im 8/30]
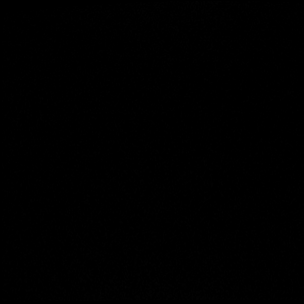
[im 15/30]
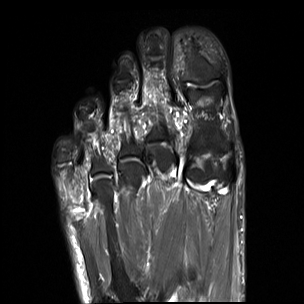
[im 22/30]
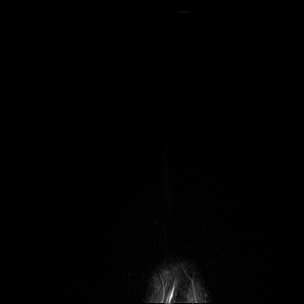
[im 30/30]
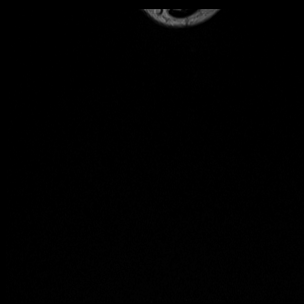

[Series 9: t2_tse_stir_sag_288 · sagittal · right · 4.0mm · 0.49mm/px · 3 of 21 slices shown]
[im 1/21]
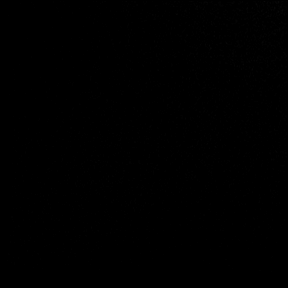
[im 11/21]
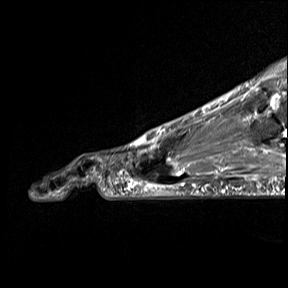
[im 21/21]
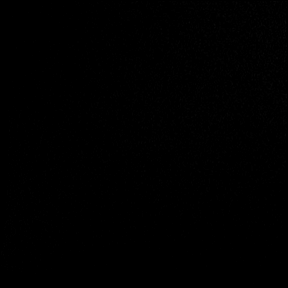

[Series 10: t1_tse_fs_tra_320 · coronal · right · 4.0mm · 0.44mm/px · 5 of 30 slices shown (2 of 2)]
[im 1/30]
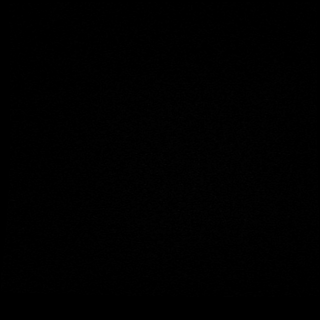
[im 8/30]
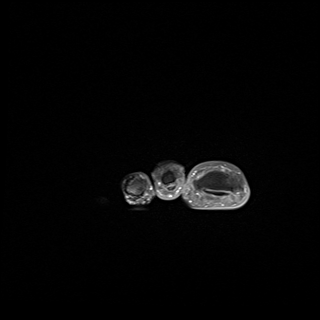
[im 15/30]
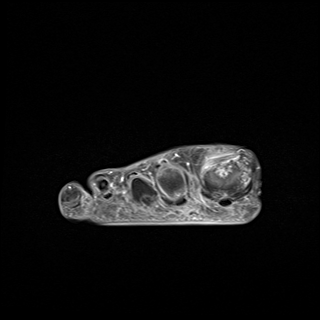
[im 22/30]
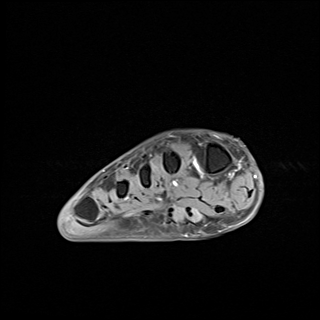
[im 30/30]
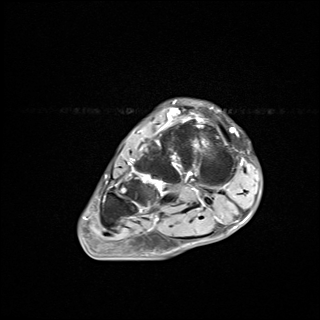

[Series 11: t1_tse_fs_sag_p2_256 · sagittal · right · 4.0mm · 0.55mm/px · 3 of 19 slices shown]
[im 1/19]
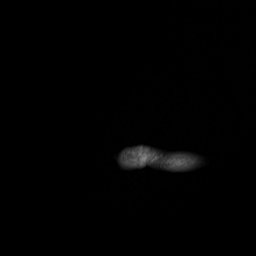
[im 10/19]
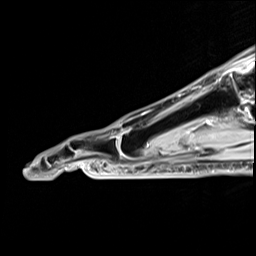
[im 19/19]
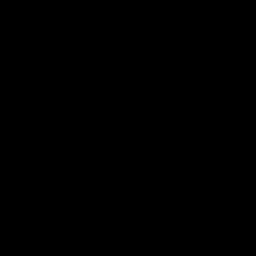

[Series 12: t1_tse_fs_cor_304 · axial · right · 4.0mm · 0.46mm/px · z∈[-164,-22]mm · 5 of 30 slices shown]
[im 1/30]
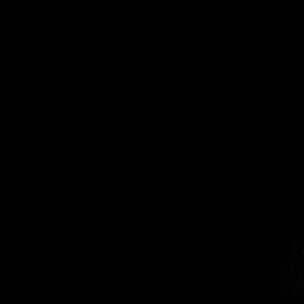
[im 8/30]
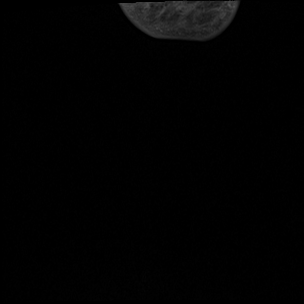
[im 15/30]
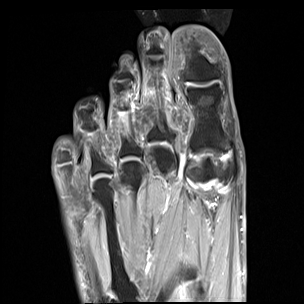
[im 22/30]
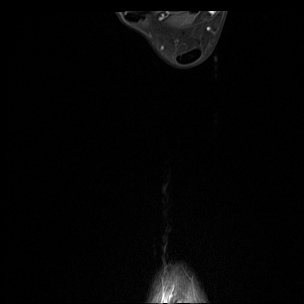
[im 30/30]
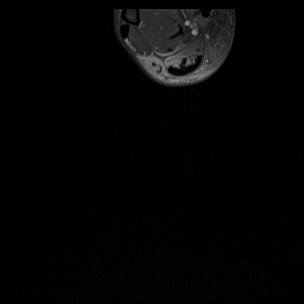

[40 of 40 positions shown; findings below may reference images not displayed]

FINDINGS: Bones/Joint/Cartilage

No marrow signal abnormality. No fracture or dislocation. Normal
alignment. No joint effusion.

High-grade partial-thickness cartilage loss with areas of
full-thickness cartilage loss of the first MTP joint with
subchondral reactive marrow edema in the first metatarsal head. Mild
osteoarthritis of the second TMT joint.

No periosteal reaction or bone destruction. No aggressive osseous
lesion.

Ligaments

Collateral ligaments are intact.  Lisfranc ligament is intact.

Muscles and Tendons
Flexor, peroneal and extensor compartment tendons are intact.
Muscles are normal.

Soft tissue
No fluid collection or hematoma.  No soft tissue mass.
IMPRESSION: 1. No soft tissue mass, fluid collection or hematoma of the right
forefoot.
2. Moderate-severe osteoarthritis of the first MTP joint.
3. Mild osteoarthritis of the second TMT joint.

## 2021-09-05 ENCOUNTER — Other Ambulatory Visit (HOSPITAL_COMMUNITY): Payer: Self-pay | Admitting: Family Medicine

## 2021-09-05 ENCOUNTER — Ambulatory Visit (HOSPITAL_COMMUNITY)
Admission: RE | Admit: 2021-09-05 | Discharge: 2021-09-05 | Disposition: A | Discharge status: Home / Self Care | Payer: Medicare HMO | Source: Ambulatory Visit | Attending: Family Medicine | Admitting: Family Medicine

## 2021-09-05 DIAGNOSIS — M79661 Pain in right lower leg: Secondary | ICD-10-CM

## 2021-09-06 ENCOUNTER — Other Ambulatory Visit: Payer: Self-pay | Admitting: Family Medicine

## 2021-09-06 ENCOUNTER — Other Ambulatory Visit (HOSPITAL_COMMUNITY): Payer: Self-pay | Admitting: Family Medicine

## 2021-09-06 DIAGNOSIS — M79661 Pain in right lower leg: Secondary | ICD-10-CM

## 2021-09-12 ENCOUNTER — Ambulatory Visit (HOSPITAL_COMMUNITY)
Admission: RE | Admit: 2021-09-12 | Discharge: 2021-09-12 | Disposition: A | Discharge status: Home / Self Care | Payer: Medicare HMO | Source: Ambulatory Visit | Attending: Family Medicine | Admitting: Family Medicine

## 2021-09-12 DIAGNOSIS — M79661 Pain in right lower leg: Secondary | ICD-10-CM | POA: Diagnosis not present

## 2021-09-25 ENCOUNTER — Other Ambulatory Visit: Payer: Self-pay

## 2021-09-25 ENCOUNTER — Ambulatory Visit (INDEPENDENT_AMBULATORY_CARE_PROVIDER_SITE_OTHER): Payer: Medicare HMO

## 2021-09-25 ENCOUNTER — Encounter: Payer: Self-pay | Admitting: Orthopaedic Surgery

## 2021-09-25 ENCOUNTER — Ambulatory Visit: Payer: Medicare HMO | Admitting: Orthopaedic Surgery

## 2021-09-25 VITALS — BP 207/119 | HR 103 | Ht 63.0 in | Wt 230.0 lb

## 2021-09-25 DIAGNOSIS — G8929 Other chronic pain: Secondary | ICD-10-CM

## 2021-09-25 DIAGNOSIS — M1711 Unilateral primary osteoarthritis, right knee: Secondary | ICD-10-CM

## 2021-09-25 NOTE — Progress Notes (Signed)
My right knee hurts.  She has right knee pain, marked, for the last few weeks. She has been seen recently at [facility]. She has had injection in the knee last Friday there. She was given Lodine Friday and is taking it now.  She is only slightly better.  She has pain and swelling and some giving way.  I have reviewed the notes.  I had seen her in 2021 for left knee pain.  Right knee has effusion, crepitus, ROM 0 to 100, limp right, uses walker, more medial pain, positive medial McMurray, NV intact.  No distal edema.  X-rays were done of the right knee, reported separately.  Encounter Diagnosis  Name Primary?   Chronic pain of right knee Yes   I would like to get a MRI of the knee.  Continue present medicine.  Return in two weeks.  Call if any problem.  Precautions discussed.  Electronically Signed Sanjuana Kava, MD 9/19/202311:47 AM

## 2021-10-09 ENCOUNTER — Ambulatory Visit: Payer: Medicare HMO | Admitting: Orthopaedic Surgery

## 2021-10-15 ENCOUNTER — Ambulatory Visit (HOSPITAL_COMMUNITY)
Admission: RE | Admit: 2021-10-15 | Discharge: 2021-10-15 | Disposition: A | Discharge status: Home / Self Care | Payer: Medicare HMO | Source: Ambulatory Visit | Attending: Orthopaedic Surgery | Admitting: Orthopaedic Surgery

## 2021-10-15 DIAGNOSIS — G8929 Other chronic pain: Secondary | ICD-10-CM | POA: Insufficient documentation

## 2021-10-15 DIAGNOSIS — M25561 Pain in right knee: Secondary | ICD-10-CM | POA: Insufficient documentation

## 2021-10-23 ENCOUNTER — Ambulatory Visit: Payer: Medicare HMO | Admitting: Orthopaedic Surgery

## 2021-10-23 ENCOUNTER — Encounter: Payer: Self-pay | Admitting: Orthopaedic Surgery

## 2021-10-23 DIAGNOSIS — M25561 Pain in right knee: Secondary | ICD-10-CM | POA: Diagnosis not present

## 2021-10-23 DIAGNOSIS — G8929 Other chronic pain: Secondary | ICD-10-CM

## 2021-10-23 MED ORDER — OXYCODONE-ACETAMINOPHEN 5-325 MG PO TABS: ORAL_TABLET | ORAL | 0 refills | Status: DC

## 2021-10-23 NOTE — Patient Instructions (Signed)
Schedule w/ Dr.Harrison

## 2021-10-23 NOTE — Progress Notes (Signed)
My knee is better today.  She has less pain in the right knee today.  She had MRI of the knee showing: IMPRESSION: 1. Radial tear involving the posterior horn of the medial meniscus at the meniscal root with detachment and associated medial protrusion of the meniscus estimated at 5 mm. 2. Intact ligamentous structures. 3. Tricompartmental degenerative changes most significant in the medial compartment. 4. Small subchondral stress fracture involving the medial tibial plateau with significant surrounding marrow edema. 5. Moderate-sized joint effusion and moderate to large leaking Baker's cyst.   I have explained the findings to her.  I will have Dr. Aline Brochure see her for a baseline visit.  She might need surgery in the future.  ROM of the right knee is 0 to 110 today, she has effusion and crepitus.  NV intact. She has positive medial McMurray.  She has very slight limp right.  Encounter Diagnosis  Name Primary?   Chronic pain of right knee Yes   To see Dr. Aline Brochure for evaluation.  She is better today.  Call if any problem.  Precautions discussed.  I have reviewed the [place] web site prior to prescribing narcotic medicine for this patient.  Electronically Signed Sanjuana Kava, MD 10/17/20239:10 AM

## 2021-11-02 ENCOUNTER — Other Ambulatory Visit: Payer: Self-pay | Admitting: Orthopaedic Surgery

## 2021-11-02 DIAGNOSIS — M79604 Pain in right leg: Secondary | ICD-10-CM

## 2021-11-06 NOTE — Progress Notes (Signed)
Chief Complaint  Patient presents with   Knee Pain    Surgical consult right knee    HPI: This is a 71 year old female who had seen Dr. Hilda Lias for right knee pain.  She was given an injection and took some Lodine and seemed to improve but because of the mechanical symptoms the patient was sent for MRI  That MRI shows a tear of the medial meniscus as well as extrusion of the meniscus 3 compartment degenerative arthritis worse in the medial compartment with subchondral stress fracture of the medial tibial plateau and surrounding edema she also had an effusion and a Baker's cyst.  Dr. Hilda Lias has advised her to see me regarding future treatments.  In reviewing her she notes that she had some discomfort in the knee but then had a sudden onset of pain after she came from the beach; SHE C/O GIVING WAY AND THAT THE KNEE IS UNSTABLE  She is still in 10 out of 10 pain unless she takes the oxycodone.  Pain is then about 3 out of 10.  Most of the pain is on the medial side of the joint and tibia  Past Medical History:  Diagnosis Date   Acid reflux disease    COPD (chronic obstructive pulmonary disease) (HCC) 03/2020   Depression    Diabetes mellitus without complication (HCC)    Goiter    Hypertension    Hypothyroidism    Pneumonia 03/2020    BP (!) 178/111   Pulse 93   Ht 5\' 3"  (1.6 m)   Wt 230 lb (104.3 kg)   BMI 40.74 kg/m    General appearance: Well-developed well-nourished no gross deformities  Cardiovascular normal pulse and perfusion normal color without edema  Neurologically no sensation loss or deficits or pathologic reflexes  Psychological: Awake alert and oriented x3 mood anxious and affect normal  Skin no lacerations or ulcerations no nodularity no palpable masses, no erythema or nodularity  Musculoskeletal: Painful range of motion of the RIGHT knee with tenderness over the medial tibia and medial joint line, no effusion.  Full extension.  Flexion arc full pain  throughout the arc.  Flexion approximately 120 degrees  Imaging  Image #1 MRI shows proximal tibial stress fracture extruded meniscus with meniscal tear osteoarthritis  Plain films show OA medial compartment grade 22  A/P  71 year old female had some knee discomfort probably from arthritis with extruded nonfunctional meniscus and meniscal tear and stress fracture which probably accounts for the acute pain  Recommend brace and walker to unload the tibia.  This usually takes 6 to 8 weeks.  Then we will recheck the function of the knee at that time and consider surgery with arthroscopy versus replacement.  She is diabetic has hypertension she has thyroid issue as well  BMI is 40.74 so we may not be able to do a knee replacement  The patient's thigh circumference does not allow [place] to use the current brace due to the mismatch.  Brace is necessary for the patient to ambulate.  I talked to her about her oxycodone.  I am going to put her on plain oxycodone and take the Tylenol she takes it approximately 2 times a day  Encounter Diagnoses  Name Primary?   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    Dispense:  10 tablet    Refill:  0   Increase vit D to 10000 daily

## 2021-11-07 ENCOUNTER — Encounter: Payer: Self-pay | Admitting: Orthopedic Surgery

## 2021-11-07 ENCOUNTER — Ambulatory Visit: Payer: Medicare HMO | Admitting: Orthopedic Surgery

## 2021-11-07 VITALS — BP 178/111 | HR 93 | Ht 63.0 in | Wt 230.0 lb

## 2021-11-07 DIAGNOSIS — M84361A Stress fracture, right tibia, initial encounter for fracture: Secondary | ICD-10-CM

## 2021-11-07 DIAGNOSIS — M23203 Derangement of unspecified medial meniscus due to old tear or injury, right knee: Secondary | ICD-10-CM

## 2021-11-07 DIAGNOSIS — M1711 Unilateral primary osteoarthritis, right knee: Secondary | ICD-10-CM

## 2021-11-07 MED ORDER — OXYCODONE HCL 5 MG PO TABS: 5.0000 mg | ORAL_TABLET | Freq: Two times a day (BID) | ORAL | 0 refills | Status: DC

## 2021-11-07 NOTE — Addendum Note (Signed)
Addended byCandice Camp on: 11/07/2021 10:35 AM   Modules accepted: Orders

## 2021-11-07 NOTE — Patient Instructions (Signed)
Take tylenol as well 500 mg every 6 hrs   Take the lodine   Take the oxycodone q12 hrs prn   Use the walker when walking   Wear the brace

## 2021-11-16 ENCOUNTER — Other Ambulatory Visit: Payer: Self-pay | Admitting: Orthopedic Surgery

## 2021-11-16 DIAGNOSIS — M79604 Pain in right leg: Secondary | ICD-10-CM

## 2021-11-19 ENCOUNTER — Telehealth: Payer: Self-pay | Admitting: Radiology

## 2021-11-19 NOTE — Telephone Encounter (Signed)
Left message for patient to call back Dr Aline Brochure wants her to d/c the Oxycodone May need to hold off on opioids until after possible surgery if she needs it down the road.

## 2021-11-19 NOTE — Telephone Encounter (Signed)
Dr Aline Brochure wants her to d/c the Oxycodone

## 2021-11-20 NOTE — Telephone Encounter (Signed)
Patient called stating she is in a lot of pain and she needs a refill on her pain medicine   oxyCODONE (OXY IR/ROXICODONE) 5 MG   Pharmacy: [facility]

## 2021-11-20 NOTE — Telephone Encounter (Signed)
I will call her again after clinic No more oxycodone per Dr Aline Brochure

## 2021-11-21 ENCOUNTER — Telehealth: Payer: Self-pay | Admitting: Orthopedic Surgery

## 2021-11-21 NOTE — Telephone Encounter (Signed)
The patient called back regarding her medication request from yesterday.  Pt's # [phone]

## 2021-11-22 ENCOUNTER — Other Ambulatory Visit (HOSPITAL_COMMUNITY): Payer: Self-pay | Admitting: Family Medicine

## 2021-11-22 DIAGNOSIS — Z13820 Encounter for screening for osteoporosis: Secondary | ICD-10-CM

## 2021-11-23 ENCOUNTER — Telehealth: Payer: Self-pay | Admitting: Orthopedic Surgery

## 2021-11-23 DIAGNOSIS — M79604 Pain in right leg: Secondary | ICD-10-CM

## 2021-11-23 NOTE — Telephone Encounter (Signed)
Multiple messages opened, unable to reach patient ,no opioids per Dr Aline Brochure

## 2021-11-23 NOTE — Telephone Encounter (Signed)
Again, I called her. I have been trying to reach her to let her know Dr Aline Brochure does not want her to take the opioids, unable to reach. Phone rang multiple times no one picked up.

## 2021-11-26 ENCOUNTER — Telehealth: Payer: Self-pay

## 2021-11-26 NOTE — Telephone Encounter (Signed)
Patient called asking why she hasn't gotten a refill sent in. I relayed to her what Amy had put in chart about Dr. Aline Brochure not wanting her to take opioids.  Patient stated that he told her to stay on them and she is out in a lot of pain also. She just didn't understand what is going on.

## 2021-12-19 ENCOUNTER — Ambulatory Visit: Payer: Medicare HMO | Admitting: Orthopedic Surgery

## 2021-12-21 ENCOUNTER — Other Ambulatory Visit (HOSPITAL_BASED_OUTPATIENT_CLINIC_OR_DEPARTMENT_OTHER): Payer: Self-pay

## 2021-12-21 DIAGNOSIS — R0683 Snoring: Secondary | ICD-10-CM

## 2021-12-21 DIAGNOSIS — R5383 Other fatigue: Secondary | ICD-10-CM

## 2021-12-21 DIAGNOSIS — I10 Essential (primary) hypertension: Secondary | ICD-10-CM

## 2022-06-06 ENCOUNTER — Other Ambulatory Visit (HOSPITAL_COMMUNITY): Payer: Self-pay | Admitting: Family Medicine

## 2022-06-06 DIAGNOSIS — Z1231 Encounter for screening mammogram for malignant neoplasm of breast: Secondary | ICD-10-CM

## 2023-03-13 ENCOUNTER — Ambulatory Visit (HOSPITAL_COMMUNITY): Payer: Medicare HMO

## 2023-03-19 ENCOUNTER — Ambulatory Visit (HOSPITAL_COMMUNITY)
Admission: RE | Admit: 2023-03-19 | Discharge: 2023-03-19 | Disposition: A | Discharge status: Home / Self Care | Payer: Self-pay | Source: Ambulatory Visit | Attending: Family Medicine | Admitting: Family Medicine

## 2023-03-19 DIAGNOSIS — Z1231 Encounter for screening mammogram for malignant neoplasm of breast: Secondary | ICD-10-CM | POA: Diagnosis present

## 2023-03-20 ENCOUNTER — Inpatient Hospital Stay
Admission: RE | Admit: 2023-03-20 | Discharge: 2023-03-20 | Disposition: A | Discharge status: Home / Self Care | Payer: Self-pay | Source: Ambulatory Visit | Attending: Family Medicine | Admitting: Family Medicine

## 2023-03-20 ENCOUNTER — Other Ambulatory Visit (HOSPITAL_COMMUNITY): Payer: Self-pay | Admitting: Family Medicine

## 2023-03-20 DIAGNOSIS — Z1231 Encounter for screening mammogram for malignant neoplasm of breast: Secondary | ICD-10-CM

## 2023-06-12 ENCOUNTER — Encounter (INDEPENDENT_AMBULATORY_CARE_PROVIDER_SITE_OTHER): Payer: Self-pay | Admitting: *Deleted

## 2023-10-02 ENCOUNTER — Other Ambulatory Visit (HOSPITAL_COMMUNITY): Payer: Self-pay | Admitting: Family Medicine

## 2023-10-02 DIAGNOSIS — R0600 Dyspnea, unspecified: Secondary | ICD-10-CM

## 2023-10-09 ENCOUNTER — Encounter (HOSPITAL_COMMUNITY): Payer: Self-pay

## 2023-10-09 ENCOUNTER — Ambulatory Visit (HOSPITAL_COMMUNITY): Admission: RE | Admit: 2023-10-09 | Source: Ambulatory Visit

## 2023-10-13 ENCOUNTER — Encounter (HOSPITAL_COMMUNITY): Payer: Self-pay | Admitting: Family Medicine

## 2023-11-12 ENCOUNTER — Ambulatory Visit (HOSPITAL_COMMUNITY)
Admission: RE | Admit: 2023-11-12 | Discharge: 2023-11-12 | Disposition: A | Discharge status: Home / Self Care | Source: Ambulatory Visit | Attending: Family Medicine | Admitting: Family Medicine

## 2023-11-12 DIAGNOSIS — R0600 Dyspnea, unspecified: Secondary | ICD-10-CM | POA: Diagnosis present

## 2023-11-12 DIAGNOSIS — R0609 Other forms of dyspnea: Secondary | ICD-10-CM

## 2023-11-12 LAB — ECHOCARDIOGRAM COMPLETE
Area-P 1/2: 3.21 cm2
P 1/2 time: 432 ms
S' Lateral: 2.9 cm

## 2023-11-12 NOTE — Progress Notes (Signed)
*  PRELIMINARY RESULTS* Echocardiogram 2D Echocardiogram has been performed.  Bethany Myers 11/12/2023, 10:17 AM
